# Patient Record
Sex: Female | Born: 1950 | Race: White | Hispanic: No | Smoking: Current every day smoker
Health system: Southern US, Community
[De-identification: ages and names within clinical notes are randomized; demographics above are authoritative.]

## PROBLEM LIST (undated history)

## (undated) DIAGNOSIS — J45909 Unspecified asthma, uncomplicated: Secondary | ICD-10-CM

## (undated) DIAGNOSIS — R0600 Dyspnea, unspecified: Secondary | ICD-10-CM

## (undated) DIAGNOSIS — J449 Chronic obstructive pulmonary disease, unspecified: Secondary | ICD-10-CM

## (undated) HISTORY — PX: EXPLORATORY LAPAROTOMY: SUR591

## (undated) HISTORY — PX: HYSTERECTOMY ABDOMINAL WITH SALPINGECTOMY: SHX6725

---

## 1974-06-10 HISTORY — PX: CRANIOPLASTY: SUR330

## 2005-01-30 ENCOUNTER — Ambulatory Visit: Payer: Self-pay

## 2005-02-01 ENCOUNTER — Ambulatory Visit: Payer: Self-pay

## 2005-08-05 ENCOUNTER — Ambulatory Visit: Payer: Self-pay

## 2005-08-23 ENCOUNTER — Other Ambulatory Visit: Payer: Self-pay

## 2005-08-29 ENCOUNTER — Ambulatory Visit: Payer: Self-pay | Admitting: Surgery

## 2006-02-05 ENCOUNTER — Ambulatory Visit: Payer: Self-pay

## 2007-06-24 ENCOUNTER — Ambulatory Visit: Payer: Self-pay

## 2007-09-16 ENCOUNTER — Ambulatory Visit: Payer: Self-pay | Admitting: *Deleted

## 2009-06-28 ENCOUNTER — Ambulatory Visit: Payer: Self-pay

## 2010-05-09 ENCOUNTER — Emergency Department: Payer: Self-pay | Admitting: Emergency Medicine

## 2010-10-02 ENCOUNTER — Inpatient Hospital Stay: Payer: Self-pay | Admitting: Internal Medicine

## 2011-07-10 ENCOUNTER — Ambulatory Visit: Payer: Self-pay | Admitting: Family Medicine

## 2012-08-30 ENCOUNTER — Emergency Department: Payer: Self-pay | Admitting: Emergency Medicine

## 2012-08-30 LAB — COMPREHENSIVE METABOLIC PANEL
Albumin: 3.4 g/dL (ref 3.4–5.0)
Anion Gap: 4 — ABNORMAL LOW (ref 7–16)
Bilirubin,Total: 0.3 mg/dL (ref 0.2–1.0)
Chloride: 100 mmol/L (ref 98–107)
Co2: 28 mmol/L (ref 21–32)
EGFR (African American): >60
EGFR (Non-African Amer.): >60
SGOT(AST): 27 U/L (ref 15–37)
SGPT (ALT): 23 U/L (ref 12–78)
Total Protein: 7.4 g/dL (ref 6.4–8.2)

## 2012-08-30 LAB — CBC
HCT: 37.5 % (ref 35.0–47.0)
HGB: 12.9 g/dL (ref 12.0–16.0)
MCH: 32 pg (ref 26.0–34.0)
MCHC: 34.4 g/dL (ref 32.0–36.0)
Platelet: 212 10*3/uL (ref 150–440)
RBC: 4.03 10*6/uL (ref 3.80–5.20)
RDW: 13.6 % (ref 11.5–14.5)
WBC: 8.5 10*3/uL (ref 3.6–11.0)

## 2012-08-30 LAB — URINALYSIS, COMPLETE
Bacteria: NONE SEEN
Bilirubin,UR: NEGATIVE
Glucose,UR: NEGATIVE mg/dL (ref 0–75)
Ketone: NEGATIVE
Leukocyte Esterase: NEGATIVE
RBC,UR: NONE SEEN /HPF (ref 0–5)
WBC UR: <1 /HPF (ref 0–5)

## 2012-08-30 LAB — LIPASE, BLOOD: Lipase: 167 U/L (ref 73–393)

## 2014-08-18 ENCOUNTER — Emergency Department: Payer: Self-pay | Admitting: Emergency Medicine

## 2014-08-26 ENCOUNTER — Ambulatory Visit: Payer: Self-pay | Admitting: Family Medicine

## 2015-09-20 ENCOUNTER — Other Ambulatory Visit: Payer: Self-pay | Admitting: Nurse Practitioner

## 2015-10-20 DIAGNOSIS — N3946 Mixed incontinence: Secondary | ICD-10-CM | POA: Insufficient documentation

## 2017-04-28 DIAGNOSIS — J439 Emphysema, unspecified: Secondary | ICD-10-CM | POA: Insufficient documentation

## 2017-08-20 ENCOUNTER — Other Ambulatory Visit: Payer: Self-pay | Admitting: Nurse Practitioner

## 2017-08-20 DIAGNOSIS — Z1239 Encounter for other screening for malignant neoplasm of breast: Secondary | ICD-10-CM

## 2017-09-11 ENCOUNTER — Other Ambulatory Visit: Payer: Self-pay

## 2017-09-11 DIAGNOSIS — Z1211 Encounter for screening for malignant neoplasm of colon: Secondary | ICD-10-CM

## 2017-09-12 ENCOUNTER — Telehealth: Payer: Self-pay

## 2017-09-12 NOTE — Telephone Encounter (Signed)
Gastroenterology Pre-Procedure Review  Request Date:  Requesting Physician: Dr.   PATIENT REVIEW QUESTIONS: The patient responded to the following health history questions as indicated:    1. Are you having any GI issues? yes (side pain, scar tissue, bloating) 2. Do you have a personal history of Polyps? yes (polyps, diverticulitis) 3. Do you have a family history of Colon Cancer or Polyps? yes (Uncle and Aunt) 4. Diabetes Mellitus? no 5. Joint replacements in the past 12 months?no 6. Major health problems in the past 3 months?yes (high BP) 7. Any artificial heart valves, MVP, or defibrillator?no    MEDICATIONS & ALLERGIES:    Patient reports the following regarding taking any anticoagulation/antiplatelet therapy:   Plavix, Coumadin, Eliquis, Xarelto, Lovenox, Pradaxa, Brilinta, or Effient? no Aspirin? no  Patient confirms/reports the following medications:  No current outpatient medications on file.   No current facility-administered medications for this visit.     Patient confirms/reports the following allergies:  Allergies not on file  No orders of the defined types were placed in this encounter.   AUTHORIZATION INFORMATION Primary Insurance: 1D#: Group #:  Secondary Insurance: 1D#: Group #:  SCHEDULE INFORMATION: Date: 10/16/17 Time: Location: [facility]

## 2017-09-18 ENCOUNTER — Inpatient Hospital Stay: Admission: RE | Admit: 2017-09-18 | Payer: BLUE CROSS/BLUE SHIELD | Source: Ambulatory Visit

## 2017-10-02 ENCOUNTER — Encounter: Payer: Self-pay | Admitting: Anesthesiology

## 2017-10-02 ENCOUNTER — Telehealth: Payer: Self-pay

## 2017-10-02 ENCOUNTER — Other Ambulatory Visit: Payer: Self-pay

## 2017-10-02 DIAGNOSIS — Z1211 Encounter for screening for malignant neoplasm of colon: Secondary | ICD-10-CM

## 2017-10-02 NOTE — Telephone Encounter (Signed)
[facility] has reviewed patients chart and she will need to have her colonoscopy [facility].  I've left a message for her to contact office to notify her.  I am going ahead and ordering colon[facility] with Dr. Allegra LaiVanga so she will have a spot on the schedule for May 9th.  Thanks [employer]

## 2017-10-07 ENCOUNTER — Telehealth: Payer: Self-pay | Admitting: Gastroenterology

## 2017-10-07 NOTE — Telephone Encounter (Signed)
Pt is returning Michelle's call °

## 2017-10-07 NOTE — Telephone Encounter (Signed)
Patient has been notified of colonoscopy location change and provider.  Thanks [employer]

## 2017-10-13 ENCOUNTER — Telehealth: Payer: Self-pay | Admitting: Gastroenterology

## 2017-10-13 NOTE — Telephone Encounter (Signed)
Patients call has been returned.  She inquired on the coding of the colonoscopy I informed her that we coded for screening colonoscopy z12.11.  She was concerned about the coverage as to if polyp would be removed.  I asked her to address with the doctor prior to colonoscopy.  Thanks  [employer]

## 2017-10-13 NOTE — Telephone Encounter (Signed)
Pt left vm for Marcelino Duster in regards to her colonoscopy  thursday

## 2017-10-15 ENCOUNTER — Telehealth: Payer: Self-pay | Admitting: Gastroenterology

## 2017-10-15 NOTE — Telephone Encounter (Signed)
Patient is congested and feels she needs to reschedule her colonoscopy that is tomorrow. Please call

## 2017-10-15 NOTE — Telephone Encounter (Signed)
Pt left vm for Marcelino Duster she states she is not feeling well and would like a call from [place]

## 2017-10-15 NOTE — Telephone Encounter (Signed)
LVM for pt to call me back-returning her call.  Thanks [employer]

## 2017-10-15 NOTE — Telephone Encounter (Signed)
Patient has been contacted.  She stated that she has some congestion and her mother has been sick.  She has asked to cancel her colonoscopy and will call back to reschedule.  Thanks [employer]

## 2017-10-16 ENCOUNTER — Ambulatory Visit
Admission: RE | Admit: 2017-10-16 | Payer: BLUE CROSS/BLUE SHIELD | Source: Ambulatory Visit | Admitting: Gastroenterology

## 2017-10-16 ENCOUNTER — Encounter: Admission: RE | Payer: Self-pay | Source: Ambulatory Visit

## 2017-10-16 HISTORY — DX: Dyspnea, unspecified: R06.00

## 2017-10-16 HISTORY — DX: Chronic obstructive pulmonary disease, unspecified: J44.9

## 2017-10-16 SURGERY — COLONOSCOPY WITH PROPOFOL
Anesthesia: General

## 2017-10-16 SURGERY — COLONOSCOPY WITH PROPOFOL
Anesthesia: Choice

## 2018-11-08 ENCOUNTER — Emergency Department: Payer: Medicare HMO

## 2018-11-08 ENCOUNTER — Inpatient Hospital Stay (HOSPITAL_COMMUNITY)
Admission: AD | Admit: 2018-11-08 | Payer: Medicaid Other | Source: Other Acute Inpatient Hospital | Admitting: Internal Medicine

## 2018-11-08 ENCOUNTER — Inpatient Hospital Stay
Admission: EM | Admit: 2018-11-08 | Discharge: 2018-11-17 | DRG: 871 | Disposition: A | Discharge status: Home Health | Payer: Medicare HMO | Attending: Internal Medicine | Admitting: Internal Medicine

## 2018-11-08 DIAGNOSIS — T68XXXA Hypothermia, initial encounter: Secondary | ICD-10-CM | POA: Diagnosis present

## 2018-11-08 DIAGNOSIS — K7200 Acute and subacute hepatic failure without coma: Secondary | ICD-10-CM | POA: Diagnosis present

## 2018-11-08 DIAGNOSIS — D689 Coagulation defect, unspecified: Secondary | ICD-10-CM | POA: Diagnosis present

## 2018-11-08 DIAGNOSIS — R0902 Hypoxemia: Secondary | ICD-10-CM

## 2018-11-08 DIAGNOSIS — R6521 Severe sepsis with septic shock: Secondary | ICD-10-CM | POA: Diagnosis present

## 2018-11-08 DIAGNOSIS — I5021 Acute systolic (congestive) heart failure: Secondary | ICD-10-CM | POA: Diagnosis present

## 2018-11-08 DIAGNOSIS — N179 Acute kidney failure, unspecified: Secondary | ICD-10-CM | POA: Diagnosis present

## 2018-11-08 DIAGNOSIS — G9341 Metabolic encephalopathy: Secondary | ICD-10-CM | POA: Diagnosis present

## 2018-11-08 DIAGNOSIS — E875 Hyperkalemia: Secondary | ICD-10-CM | POA: Diagnosis present

## 2018-11-08 DIAGNOSIS — E876 Hypokalemia: Secondary | ICD-10-CM | POA: Diagnosis present

## 2018-11-08 DIAGNOSIS — J9621 Acute and chronic respiratory failure with hypoxia: Secondary | ICD-10-CM | POA: Diagnosis present

## 2018-11-08 DIAGNOSIS — F1010 Alcohol abuse, uncomplicated: Secondary | ICD-10-CM | POA: Diagnosis not present

## 2018-11-08 DIAGNOSIS — A419 Sepsis, unspecified organism: Secondary | ICD-10-CM | POA: Diagnosis present

## 2018-11-08 DIAGNOSIS — J439 Emphysema, unspecified: Secondary | ICD-10-CM

## 2018-11-08 DIAGNOSIS — I4891 Unspecified atrial fibrillation: Secondary | ICD-10-CM | POA: Diagnosis not present

## 2018-11-08 DIAGNOSIS — F17210 Nicotine dependence, cigarettes, uncomplicated: Secondary | ICD-10-CM | POA: Diagnosis present

## 2018-11-08 DIAGNOSIS — Z20828 Contact with and (suspected) exposure to other viral communicable diseases: Secondary | ICD-10-CM | POA: Diagnosis present

## 2018-11-08 DIAGNOSIS — J9602 Acute respiratory failure with hypercapnia: Secondary | ICD-10-CM

## 2018-11-08 DIAGNOSIS — T510X1A Toxic effect of ethanol, accidental (unintentional), initial encounter: Secondary | ICD-10-CM | POA: Diagnosis present

## 2018-11-08 DIAGNOSIS — Z79899 Other long term (current) drug therapy: Secondary | ICD-10-CM | POA: Diagnosis not present

## 2018-11-08 DIAGNOSIS — J9690 Respiratory failure, unspecified, unspecified whether with hypoxia or hypercapnia: Secondary | ICD-10-CM

## 2018-11-08 DIAGNOSIS — J9600 Acute respiratory failure, unspecified whether with hypoxia or hypercapnia: Secondary | ICD-10-CM

## 2018-11-08 DIAGNOSIS — E872 Acidosis, unspecified: Secondary | ICD-10-CM

## 2018-11-08 DIAGNOSIS — J690 Pneumonitis due to inhalation of food and vomit: Secondary | ICD-10-CM | POA: Diagnosis present

## 2018-11-08 DIAGNOSIS — T40601A Poisoning by unspecified narcotics, accidental (unintentional), initial encounter: Secondary | ICD-10-CM | POA: Diagnosis present

## 2018-11-08 DIAGNOSIS — Z90710 Acquired absence of both cervix and uterus: Secondary | ICD-10-CM | POA: Diagnosis not present

## 2018-11-08 DIAGNOSIS — Z0189 Encounter for other specified special examinations: Secondary | ICD-10-CM

## 2018-11-08 DIAGNOSIS — J9622 Acute and chronic respiratory failure with hypercapnia: Secondary | ICD-10-CM | POA: Diagnosis present

## 2018-11-08 DIAGNOSIS — Z7951 Long term (current) use of inhaled steroids: Secondary | ICD-10-CM

## 2018-11-08 DIAGNOSIS — N183 Chronic kidney disease, stage 3 (moderate): Secondary | ICD-10-CM | POA: Diagnosis present

## 2018-11-08 DIAGNOSIS — I959 Hypotension, unspecified: Secondary | ICD-10-CM

## 2018-11-08 DIAGNOSIS — J441 Chronic obstructive pulmonary disease with (acute) exacerbation: Secondary | ICD-10-CM | POA: Diagnosis not present

## 2018-11-08 HISTORY — DX: Unspecified asthma, uncomplicated: J45.909

## 2018-11-08 LAB — URINALYSIS, ROUTINE W REFLEX MICROSCOPIC
Bacteria, UA: NONE SEEN
Bilirubin Urine: NEGATIVE
Glucose, UA: NEGATIVE mg/dL
Hgb urine dipstick: NEGATIVE
Ketones, ur: 5 mg/dL — AB
Leukocytes,Ua: NEGATIVE
Nitrite: NEGATIVE
Protein, ur: 100 mg/dL — AB
Specific Gravity, Urine: 1.021 (ref 1.005–1.030)
pH: 5 (ref 5.0–8.0)

## 2018-11-08 LAB — BLOOD GAS, ARTERIAL
Acid-Base Excess: 4.3 mmol/L — ABNORMAL HIGH (ref 0.0–2.0)
Acid-base deficit: 7.9 mmol/L — ABNORMAL HIGH (ref 0.0–2.0)
Acid-base deficit: 8.3 mmol/L — ABNORMAL HIGH (ref 0.0–2.0)
Acid-base deficit: 2.2 mmol/L — ABNORMAL HIGH (ref 0.0–2.0)
Bicarbonate: 26.4 mmol/L (ref 20.0–28.0)
Bicarbonate: 26.8 mmol/L (ref 20.0–28.0)
Bicarbonate: 29.9 mmol/L — ABNORMAL HIGH (ref 20.0–28.0)
Bicarbonate: 29.8 mmol/L — ABNORMAL HIGH (ref 20.0–28.0)
Delivery systems: POSITIVE
Delivery systems: POSITIVE
Expiratory PAP: 10
Expiratory PAP: 10
FIO2: 0.6
FIO2: 0.7
FIO2: 0.4
FIO2: 0.4
Inspiratory PAP: 18
Inspiratory PAP: 18
MECHVT: 400 mL
MECHVT: 450 mL
O2 Saturation: 87.6 %
O2 Saturation: 87.9 %
O2 Saturation: 87.5 %
O2 Saturation: 95 %
PEEP: 8 cmH2O
PEEP: 8 cmH2O
Patient temperature: 33.3
Patient temperature: 34.4
Patient temperature: 34.4
Patient temperature: 37
RATE: 30 resp/min
RATE: 30 resp/min
RATE: 20 resp/min
pCO2 arterial: 112 mmHg (ref 32.0–48.0)
pCO2 arterial: >120 mmHg (ref 32.0–48.0)
pCO2 arterial: 94 mmHg (ref 32.0–48.0)
pCO2 arterial: 47 mmHg (ref 32.0–48.0)
pH, Arterial: 7.02 — CL (ref 7.350–7.450)
pH, Arterial: 6.98 — CL (ref 7.350–7.450)
pH, Arterial: 7.14 — CL (ref 7.350–7.450)
pH, Arterial: 7.41 (ref 7.350–7.450)
pO2, Arterial: 65 mmHg — ABNORMAL LOW (ref 83.0–108.0)
pO2, Arterial: 72 mmHg — ABNORMAL LOW (ref 83.0–108.0)
pO2, Arterial: 61 mmHg — ABNORMAL LOW (ref 83.0–108.0)
pO2, Arterial: 75 mmHg — ABNORMAL LOW (ref 83.0–108.0)

## 2018-11-08 LAB — COMPREHENSIVE METABOLIC PANEL
ALT: 966 U/L — ABNORMAL HIGH (ref 0–44)
AST: 1305 U/L — ABNORMAL HIGH (ref 15–41)
Albumin: 3.2 g/dL — ABNORMAL LOW (ref 3.5–5.0)
Alkaline Phosphatase: 72 U/L (ref 38–126)
Anion gap: 14 (ref 5–15)
BUN: 14 mg/dL (ref 8–23)
CO2: 24 mmol/L (ref 22–32)
Calcium: 7.5 mg/dL — ABNORMAL LOW (ref 8.9–10.3)
Chloride: 92 mmol/L — ABNORMAL LOW (ref 98–111)
Creatinine, Ser: 1.39 mg/dL — ABNORMAL HIGH (ref 0.44–1.00)
GFR calc Af Amer: 46 mL/min — ABNORMAL LOW (ref >60)
GFR calc non Af Amer: 40 mL/min — ABNORMAL LOW (ref >60)
Glucose, Bld: 121 mg/dL — ABNORMAL HIGH (ref 70–99)
Potassium: 6.4 mmol/L (ref 3.5–5.1)
Sodium: 130 mmol/L — ABNORMAL LOW (ref 135–145)
Total Bilirubin: 1.3 mg/dL — ABNORMAL HIGH (ref 0.3–1.2)
Total Protein: 6.6 g/dL (ref 6.5–8.1)

## 2018-11-08 LAB — BASIC METABOLIC PANEL
Anion gap: 14 (ref 5–15)
BUN: 16 mg/dL (ref 8–23)
CO2: 24 mmol/L (ref 22–32)
Calcium: 7.9 mg/dL — ABNORMAL LOW (ref 8.9–10.3)
Chloride: 93 mmol/L — ABNORMAL LOW (ref 98–111)
Creatinine, Ser: 1.36 mg/dL — ABNORMAL HIGH (ref 0.44–1.00)
GFR calc Af Amer: 47 mL/min — ABNORMAL LOW (ref >60)
GFR calc non Af Amer: 41 mL/min — ABNORMAL LOW (ref >60)
Glucose, Bld: 153 mg/dL — ABNORMAL HIGH (ref 70–99)
Potassium: 5.6 mmol/L — ABNORMAL HIGH (ref 3.5–5.1)
Sodium: 131 mmol/L — ABNORMAL LOW (ref 135–145)

## 2018-11-08 LAB — BRAIN NATRIURETIC PEPTIDE: B Natriuretic Peptide: 623 pg/mL — ABNORMAL HIGH (ref 0.0–100.0)

## 2018-11-08 LAB — MRSA PCR SCREENING: MRSA by PCR: NEGATIVE

## 2018-11-08 LAB — CBC
HCT: 49.6 % — ABNORMAL HIGH (ref 36.0–46.0)
Hemoglobin: 15.7 g/dL — ABNORMAL HIGH (ref 12.0–15.0)
MCH: 30.9 pg (ref 26.0–34.0)
MCHC: 31.7 g/dL (ref 30.0–36.0)
MCV: 97.6 fL (ref 80.0–100.0)
Platelets: 287 10*3/uL (ref 150–400)
RBC: 5.08 MIL/uL (ref 3.87–5.11)
RDW: 16.8 % — ABNORMAL HIGH (ref 11.5–15.5)
WBC: 15.2 10*3/uL — ABNORMAL HIGH (ref 4.0–10.5)
nRBC: 0.3 % — ABNORMAL HIGH (ref 0.0–0.2)

## 2018-11-08 LAB — APTT: aPTT: 81 seconds — ABNORMAL HIGH (ref 24–36)

## 2018-11-08 LAB — GLUCOSE, CAPILLARY
Glucose-Capillary: 111 mg/dL — ABNORMAL HIGH (ref 70–99)
Glucose-Capillary: 155 mg/dL — ABNORMAL HIGH (ref 70–99)

## 2018-11-08 LAB — TSH: TSH: 2.136 u[IU]/mL (ref 0.350–4.500)

## 2018-11-08 LAB — PROTIME-INR
INR: 5.8 (ref 0.8–1.2)
Prothrombin Time: 50.9 seconds — ABNORMAL HIGH (ref 11.4–15.2)

## 2018-11-08 LAB — LACTIC ACID, PLASMA
Lactic Acid, Venous: 2.4 mmol/L (ref 0.5–1.9)
Lactic Acid, Venous: 2.1 mmol/L (ref 0.5–1.9)

## 2018-11-08 LAB — TROPONIN I
Troponin I: <0.03 ng/mL (ref <0.03)
Troponin I: 0.06 ng/mL (ref <0.03)

## 2018-11-08 LAB — ACETAMINOPHEN LEVEL: Acetaminophen (Tylenol), Serum: <10 ug/mL — ABNORMAL LOW (ref 10–30)

## 2018-11-08 LAB — ETHANOL: Alcohol, Ethyl (B): <10 mg/dL (ref <10)

## 2018-11-08 LAB — SARS CORONAVIRUS 2 BY RT PCR (HOSPITAL ORDER, PERFORMED IN ~~LOC~~ HOSPITAL LAB): SARS Coronavirus 2: NEGATIVE

## 2018-11-08 LAB — SALICYLATE LEVEL: Salicylate Lvl: <7 mg/dL (ref 2.8–30.0)

## 2018-11-08 LAB — PROCALCITONIN: Procalcitonin: 0.51 ng/mL

## 2018-11-08 MED ORDER — DILTIAZEM HCL 25 MG/5ML IV SOLN
INTRAVENOUS | Status: AC
  Administered 2018-11-08: 13:00:00 10 mg via INTRAVENOUS
  Filled 2018-11-08: qty 5

## 2018-11-08 MED ORDER — SODIUM CHLORIDE 0.9 % IV SOLN
2.0000 g | Freq: Two times a day (BID) | INTRAVENOUS | Status: DC
  Administered 2018-11-08 – 2018-11-09 (×2): 2 g via INTRAVENOUS
  Filled 2018-11-08 (×3): qty 2

## 2018-11-08 MED ORDER — LORAZEPAM 2 MG/ML IJ SOLN: 1.0000 mg | Freq: Once | INTRAMUSCULAR | Status: DC

## 2018-11-08 MED ORDER — NOREPINEPHRINE 4 MG/250ML-% IV SOLN
0.0000 ug/min | INTRAVENOUS | Status: DC
  Administered 2018-11-08: 30 ug/min via INTRAVENOUS
  Administered 2018-11-08: 18:00:00 35 ug/min via INTRAVENOUS
  Administered 2018-11-08: 0.5 ug/min via INTRAVENOUS
  Administered 2018-11-08: 16:00:00 35 ug/min via INTRAVENOUS
  Administered 2018-11-09: 07:00:00 8 ug/min via INTRAVENOUS
  Administered 2018-11-09: 01:00:00 14 ug/min via INTRAVENOUS
  Administered 2018-11-09: 16:00:00 8 ug/min via INTRAVENOUS
  Administered 2018-11-10 (×2): 6 ug/min via INTRAVENOUS
  Administered 2018-11-11: 02:00:00 4 ug/min via INTRAVENOUS
  Filled 2018-11-08 (×8): qty 250

## 2018-11-08 MED ORDER — IPRATROPIUM-ALBUTEROL 0.5-2.5 (3) MG/3ML IN SOLN
RESPIRATORY_TRACT | Status: AC
  Filled 2018-11-08: qty 6

## 2018-11-08 MED ORDER — FENTANYL 2500MCG IN NS 250ML (10MCG/ML) PREMIX INFUSION
INTRAVENOUS | Status: AC
  Filled 2018-11-08: qty 250

## 2018-11-08 MED ORDER — NOREPINEPHRINE 4 MG/250ML-% IV SOLN
INTRAVENOUS | Status: AC
  Administered 2018-11-08: 30 ug/min via INTRAVENOUS
  Filled 2018-11-08: qty 250

## 2018-11-08 MED ORDER — HALOPERIDOL LACTATE 5 MG/ML IJ SOLN
5.0000 mg | Freq: Once | INTRAMUSCULAR | Status: AC
  Administered 2018-11-08: 5 mg via INTRAVENOUS

## 2018-11-08 MED ORDER — HALOPERIDOL LACTATE 5 MG/ML IJ SOLN
INTRAMUSCULAR | Status: AC
  Administered 2018-11-08: 5 mg via INTRAVENOUS
  Filled 2018-11-08: qty 1

## 2018-11-08 MED ORDER — SODIUM CHLORIDE 0.9 % IV BOLUS
1000.0000 mL | Freq: Once | INTRAVENOUS | Status: AC
  Administered 2018-11-08: 1000 mL via INTRAVENOUS

## 2018-11-08 MED ORDER — SODIUM CHLORIDE 0.9 % IV SOLN
2.0000 g | Freq: Once | INTRAVENOUS | Status: AC
  Administered 2018-11-08: 2 g via INTRAVENOUS
  Filled 2018-11-08: qty 2

## 2018-11-08 MED ORDER — HALOPERIDOL LACTATE 5 MG/ML IJ SOLN
INTRAMUSCULAR | Status: AC
  Filled 2018-11-08: qty 1

## 2018-11-08 MED ORDER — SODIUM CHLORIDE 0.9 % IV SOLN
8.0000 mg/h | INTRAVENOUS | Status: DC
  Administered 2018-11-08 – 2018-11-09 (×2): 8 mg/h via INTRAVENOUS
  Filled 2018-11-08 (×3): qty 80

## 2018-11-08 MED ORDER — PROPOFOL 1000 MG/100ML IV EMUL
INTRAVENOUS | Status: AC
  Administered 2018-11-08: 5 ug/kg/min via INTRAVENOUS
  Filled 2018-11-08: qty 100

## 2018-11-08 MED ORDER — VANCOMYCIN HCL 500 MG IV SOLR
500.0000 mg | INTRAVENOUS | Status: DC
  Administered 2018-11-08: 500 mg via INTRAVENOUS
  Filled 2018-11-08 (×2): qty 500

## 2018-11-08 MED ORDER — FENTANYL 2500MCG IN NS 250ML (10MCG/ML) PREMIX INFUSION
0.0000 ug/h | INTRAVENOUS | Status: DC
  Administered 2018-11-08: 10 ug/h via INTRAVENOUS
  Administered 2018-11-09: 01:00:00 400 ug/h via INTRAVENOUS
  Administered 2018-11-09: 150 ug/h via INTRAVENOUS
  Administered 2018-11-10: 16:00:00 175 ug/h via INTRAVENOUS
  Administered 2018-11-10: 02:00:00 225 ug/h via INTRAVENOUS
  Administered 2018-11-11: 175 ug/h via INTRAVENOUS
  Filled 2018-11-08 (×5): qty 250

## 2018-11-08 MED ORDER — NALOXONE HCL 2 MG/2ML IJ SOSY
2.0000 mg | PREFILLED_SYRINGE | Freq: Once | INTRAMUSCULAR | Status: AC
  Administered 2018-11-08: 2 mg via INTRAVENOUS

## 2018-11-08 MED ORDER — VANCOMYCIN HCL 500 MG IV SOLR
500.0000 mg | INTRAVENOUS | Status: DC
  Filled 2018-11-08: qty 500

## 2018-11-08 MED ORDER — IPRATROPIUM-ALBUTEROL 0.5-2.5 (3) MG/3ML IN SOLN
RESPIRATORY_TRACT | Status: AC
  Filled 2018-11-08: qty 3

## 2018-11-08 MED ORDER — SODIUM BICARBONATE 8.4 % IV SOLN
INTRAVENOUS | Status: AC
  Filled 2018-11-08: qty 50

## 2018-11-08 MED ORDER — PROPOFOL 1000 MG/100ML IV EMUL
INTRAVENOUS | Status: AC
  Administered 2018-11-08: 45 ug/kg/min via INTRAVENOUS
  Filled 2018-11-08: qty 100

## 2018-11-08 MED ORDER — SODIUM BICARBONATE 8.4 % IV SOLN
50.0000 meq | Freq: Once | INTRAVENOUS | Status: AC
  Administered 2018-11-08: 50 meq via INTRAVENOUS

## 2018-11-08 MED ORDER — VANCOMYCIN HCL IN DEXTROSE 1-5 GM/200ML-% IV SOLN
1000.0000 mg | Freq: Once | INTRAVENOUS | Status: AC
  Administered 2018-11-08: 1000 mg via INTRAVENOUS
  Filled 2018-11-08: qty 200

## 2018-11-08 MED ORDER — IPRATROPIUM-ALBUTEROL 0.5-2.5 (3) MG/3ML IN SOLN
3.0000 mL | Freq: Once | RESPIRATORY_TRACT | Status: AC
  Administered 2018-11-08: 3 mL via RESPIRATORY_TRACT

## 2018-11-08 MED ORDER — ORAL CARE MOUTH RINSE
15.0000 mL | OROMUCOSAL | Status: DC
  Administered 2018-11-08 – 2018-11-14 (×49): 15 mL via OROMUCOSAL

## 2018-11-08 MED ORDER — SUCCINYLCHOLINE CHLORIDE 20 MG/ML IJ SOLN
100.0000 mg | Freq: Once | INTRAMUSCULAR | Status: AC
  Administered 2018-11-08: 100 mg via INTRAVENOUS

## 2018-11-08 MED ORDER — SODIUM BICARBONATE 8.4 % IV SOLN
INTRAVENOUS | Status: DC
  Administered 2018-11-08 – 2018-11-09 (×3): via INTRAVENOUS
  Filled 2018-11-08 (×7): qty 150

## 2018-11-08 MED ORDER — CHLORHEXIDINE GLUCONATE 0.12% ORAL RINSE (MEDLINE KIT)
15.0000 mL | Freq: Two times a day (BID) | OROMUCOSAL | Status: DC
  Administered 2018-11-08 – 2018-11-14 (×11): 15 mL via OROMUCOSAL

## 2018-11-08 MED ORDER — IPRATROPIUM-ALBUTEROL 0.5-2.5 (3) MG/3ML IN SOLN
3.0000 mL | Freq: Four times a day (QID) | RESPIRATORY_TRACT | Status: DC
  Administered 2018-11-09 – 2018-11-10 (×7): 3 mL via RESPIRATORY_TRACT
  Filled 2018-11-08 (×6): qty 3

## 2018-11-08 MED ORDER — PROPOFOL 1000 MG/100ML IV EMUL
5.0000 ug/kg/min | INTRAVENOUS | Status: DC
  Administered 2018-11-08: 45 ug/kg/min via INTRAVENOUS
  Administered 2018-11-08: 5 ug/kg/min via INTRAVENOUS

## 2018-11-08 MED ORDER — HALOPERIDOL LACTATE 5 MG/ML IJ SOLN
5.0000 mg | Freq: Once | INTRAMUSCULAR | Status: AC
  Administered 2018-11-08: 10:00:00 5 mg via INTRAVENOUS

## 2018-11-08 MED ORDER — VANCOMYCIN HCL 10 G IV SOLR: 1.0000 g | Freq: Once | INTRAVENOUS | Status: DC

## 2018-11-08 MED ORDER — NOREPINEPHRINE 4 MG/250ML-% IV SOLN
INTRAVENOUS | Status: AC
  Administered 2018-11-08: 0.5 ug/min via INTRAVENOUS
  Filled 2018-11-08: qty 250

## 2018-11-08 MED ORDER — LORAZEPAM 2 MG/ML IJ SOLN
1.0000 mg | Freq: Once | INTRAMUSCULAR | Status: AC
  Administered 2018-11-08: 1 mg via INTRAVENOUS

## 2018-11-08 MED ORDER — DILTIAZEM HCL 25 MG/5ML IV SOLN
10.0000 mg | Freq: Once | INTRAVENOUS | Status: AC
  Administered 2018-11-08: 10 mg via INTRAVENOUS

## 2018-11-08 MED ORDER — NALOXONE HCL 2 MG/2ML IJ SOSY
PREFILLED_SYRINGE | INTRAMUSCULAR | Status: AC
  Administered 2018-11-08: 2 mg
  Filled 2018-11-08: qty 4

## 2018-11-08 MED ORDER — IPRATROPIUM-ALBUTEROL 0.5-2.5 (3) MG/3ML IN SOLN
3.0000 mL | RESPIRATORY_TRACT | Status: DC | PRN
  Administered 2018-11-08: 3 mL via RESPIRATORY_TRACT
  Filled 2018-11-08: qty 3

## 2018-11-08 MED ORDER — SODIUM CHLORIDE 0.9 % IV SOLN
80.0000 mg | Freq: Once | INTRAVENOUS | Status: AC
  Administered 2018-11-08: 80 mg via INTRAVENOUS
  Filled 2018-11-08: qty 80

## 2018-11-08 MED ORDER — PANTOPRAZOLE SODIUM 40 MG IV SOLR: 40.0000 mg | Freq: Two times a day (BID) | INTRAVENOUS | Status: DC

## 2018-11-08 MED ORDER — PROPOFOL 1000 MG/100ML IV EMUL
5.0000 ug/kg/min | INTRAVENOUS | Status: DC
  Administered 2018-11-08: 75 ug/kg/min via INTRAVENOUS
  Administered 2018-11-09: 40 ug/kg/min via INTRAVENOUS
  Administered 2018-11-10 (×3): 20 ug/kg/min via INTRAVENOUS
  Administered 2018-11-11: 30 ug/kg/min via INTRAVENOUS
  Administered 2018-11-11: 20 ug/kg/min via INTRAVENOUS
  Administered 2018-11-12: 07:00:00 30 ug/kg/min via INTRAVENOUS
  Administered 2018-11-12: 50 ug/kg/min via INTRAVENOUS
  Filled 2018-11-08 (×9): qty 100

## 2018-11-08 MED ORDER — MIDAZOLAM HCL 2 MG/2ML IJ SOLN
2.0000 mg | Freq: Once | INTRAMUSCULAR | Status: AC
  Administered 2018-11-08: 13:00:00 2 mg via INTRAVENOUS

## 2018-11-08 MED ORDER — LORAZEPAM 2 MG/ML IJ SOLN
INTRAMUSCULAR | Status: AC
  Filled 2018-11-08: qty 1

## 2018-11-08 NOTE — ED Notes (Signed)
Pt placed on bipap at this time.

## 2018-11-08 NOTE — ED Notes (Signed)
Pt brother Kathlene November [phone] given update on pt condition

## 2018-11-08 NOTE — ED Notes (Signed)
Fluids paused at this time. Verbal order from MD.

## 2018-11-08 NOTE — ED Notes (Signed)
RT changed bipap mask sizes

## 2018-11-08 NOTE — ED Notes (Signed)
New bag of levophed started

## 2018-11-08 NOTE — ED Notes (Signed)
Date and time results received: 11/08/18 1430   Test: lactic acid Critical Value: 2.1  Name of Provider Notified: Dr. Darnelle Catalan

## 2018-11-08 NOTE — ED Notes (Signed)
Fluids placed on pressure bag.

## 2018-11-08 NOTE — Progress Notes (Signed)
CODE SEPSIS - PHARMACY COMMUNICATION  **Broad Spectrum Antibiotics should be administered within 1 hour of Sepsis diagnosis**  Time Code Sepsis Called/Page Received: 1523  Antibiotics Ordered: vanc/cefepime  Time of 1st antibiotic administration: 1141 (prior to code sepsis page)  Additional action taken by pharmacy: none  If necessary, Name of Provider/Nurse Contacted: none   Pricilla Riffle, PharmD Pharmacy Resident  11/08/2018 3:30 PM

## 2018-11-08 NOTE — ED Provider Notes (Addendum)
[facility] Emergency Department Provider Note   ____________________________________________   First MD Initiated Contact with Patient 11/08/18 1106     (approximate)  I have reviewed the triage vital signs and the nursing notes.   HISTORY  Chief Complaint No chief complaint on file.  Complaint is unresponsive HPI Angela Berry is a 68 y.o. female who is brought in by EMS.  EMS reports she is staying with somebody they found her unresponsive last night they stayed with her all night long with today she was still unresponsive they called EMS.  EMS reports pinpoint pupils.  They gave her 5 of Narcan and she woke up and was confused.  Pupils became more mid position.  In the emergency room she is confused will not lay still will not stop trying to get off the bed.  We gave her 5 of Haldol and then 1 mg of Ativan and 5 more Haldol as the first 5 of Haldol did not do anything.  She was wheezing we gave her a DuoNeb.  Evaluating the patient she has no JVD shows no edema no enlarged liver on palpation.  Chest x-ray comes back as looking like CHF.  Patient is put on BiPAP initial blood gases very bad however patient is now on BiPAP after bit on BiPAP and head CT repeat blood gas was worse patient was then intubated with #7 tube under direct vision tube went in well patient's hypothermic at 93 she has been on bear hugger white blood count is elevated lactic acid is elevated Maxipime was ordered as well as Vanco.  No obvious source presently.        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 Authorizing Provider  amLODipine (NORVASC) 5 MG tablet Take 5 mg by mouth daily. 10/07/18  Yes [provider]  lisinopril (PRINIVIL,ZESTRIL) 40 MG tablet Take 40 mg by mouth daily.    Yes [provider]  metoprolol succinate (TOPROL-XL) 50 MG 24 hr tablet Take 50 mg by mouth daily.    Yes [provider]  albuterol (PROVENTIL HFA) 108 (90 Base) MCG/ACT inhaler Inhale into the lungs.    [provider]  budesonide-formoterol (SYMBICORT) 160-4.5 MCG/ACT inhaler Inhale into the lungs. 03/12/17 03/12/18  [provider]  cetirizine (ZYRTEC) 10 MG tablet Take 10 mg by mouth daily.    [provider]  ipratropium (ATROVENT HFA) 17 MCG/ACT inhaler Inhale into the lungs.    [provider]  ipratropium-albuterol (DUONEB) 0.5-2.5 (3) MG/3ML SOLN  03/04/17   [provider]    Allergies Patient has no known allergies.  History reviewed. No pertinent family history.  Social History Social History   Tobacco Use   Smoking status: Current Every Day Smoker    Packs/day: 1.00    Years: 40.00    Pack years: 40.00    Types: Cigarettes   Smokeless tobacco: Never Used  Substance Use Topics   Alcohol use: Yes    Comment:  occasional   Drug use: Not on file    Review of Systems Unable to obtain due to mental status  ____________________________________________   PHYSICAL EXAM:  VITAL SIGNS: ED Triage Vitals  Enc Vitals Group     BP 11/08/18 1022 (!) 87/76     Pulse Rate 11/08/18 1022 (!) 105     Resp 11/08/18 1022 (!) 36     Temp 11/08/18 1130 (!) 93.8 F (34.3 C)     Temp Source 11/08/18 1130 Rectal     SpO2 11/08/18 1022 93 %     Weight --      Height --      Head Circumference --      Peak Flow --      Pain Score 11/08/18 1022 0     Pain Loc --      Pain Edu? --      Excl. in GC? --    Constitutional: Confused and  combative Eyes: Conjunctivae are normal.  Pupils initially mid position and go back down to small with some more Narcan patient pupils become  mid position again Head: Atraumatic. Nose: No congestion/rhinnorhea. Mouth/Throat: Mucous membranes are moist.  Oropharynx non-erythematous. Neck: No stridor.  Supple Cardiovascular: Normal rate, regular rhythm. Grossly normal heart sounds.  Good peripheral circulation.  Later rapid rate and irregular rhythm Respiratory: Normal respiratory effort.  No retractions. Lungs wheezes Gastrointestinal: Soft and nontender. No distention. No abdominal bruits.  Musculoskeletal: No lower extremity tenderness nor edema.  . Neurologic: Confused combative but moving all extremities equally and well Skin:  Skin is warm, dry and intact. No rash noted.   ____________________________________________   LABS (all labs ordered are listed, but only abnormal results are displayed)  Labs Reviewed  CBC - Abnormal; Notable for the following components:      Result Value   WBC 15.2 (*)    Hemoglobin 15.7 (*)    HCT 49.6 (*)    RDW 16.8 (*)    nRBC 0.3 (*)    All other components within normal limits  BRAIN NATRIURETIC PEPTIDE - Abnormal; Notable for the following components:   B Natriuretic Peptide 623.0 (*)    All other components within normal limits  BLOOD GAS, ARTERIAL - Abnormal; Notable for the following components:   pH, Arterial 7.02 (*)    pCO2 arterial 112 (*)    pO2, Arterial 65 (*)    Acid-base deficit 7.9 (*)    All other components within normal limits  GLUCOSE, CAPILLARY - Abnormal; Notable for the following components:   Glucose-Capillary 111 (*)    All other components within normal limits  ACETAMINOPHEN LEVEL - Abnormal; Notable for the following components:   Acetaminophen (Tylenol), Serum <10 (*)    All other components within normal limits  PROTIME-INR - Abnormal; Notable for the following components:   Prothrombin Time 50.9 (*)    INR  5.8 (*)    All other components within normal limits  APTT - Abnormal; Notable for the following components:   aPTT 81 (*)    All other components within normal limits  LACTIC ACID, PLASMA - Abnormal; Notable for the following components:   Lactic Acid, Venous 2.4 (*)    All other components within normal limits  BLOOD GAS, ARTERIAL - Abnormal; Notable for the following components:   pH, Arterial 6.98 (*)    pCO2 arterial >120.0 (*)    pO2, Arterial 72 (*)    Acid-base deficit 8.3 (*)    All  other components within normal limits  SARS CORONAVIRUS 2 (HOSPITAL ORDER, PERFORMED IN [place] [facility])  CULTURE, BLOOD (ROUTINE X 2)  CULTURE, BLOOD (ROUTINE X 2)  ETHANOL  SALICYLATE LEVEL  LACTIC ACID, PLASMA  COMPREHENSIVE METABOLIC PANEL  TROPONIN I  TSH  BLOOD GAS, ARTERIAL  CBG MONITORING, ED   ____________________________________________  EKG  Initial EKG read interpreted by me shows sinus tachycardia at a rate of 108 rightward axis decreased R wave progression no acute ST-T wave changes EKG #2 shows A. fib with RVR at a rate of 131 same axis no acute ST-T wave changes ____________________________________________  RADIOLOGY  ED MD interpretation: Head CT read by radiology reviewed by me shows no intracranial changes there are changes consistent with sinusitis however  Official radiology report(s): Ct Head Wo Contrast  Result Date: 11/08/2018 CLINICAL DATA:  Found unresponsive. EXAM: CT HEAD WITHOUT CONTRAST TECHNIQUE: Contiguous axial images were obtained from the base of the skull through the vertex without intravenous contrast. COMPARISON:  None. FINDINGS: Brain: Limited motion degraded scan. No evidence of parenchymal hemorrhage or extra-axial fluid collection. No mass lesion, mass effect, or midline shift. No CT evidence of acute infarction. Cerebral volume is age appropriate. No ventriculomegaly. Vascular: No acute abnormality. Skull: No evidence of calvarial  fracture. Sinuses/Orbits: Fluid level in the right maxillary sinus. Mucoperiosteal thickening in the ethmoidal air cells and maxillary sinuses. Other:  The mastoid air cells are unopacified. IMPRESSION: 1. Limited motion degraded scan. 2. No evidence of acute intracranial abnormality. No evidence of calvarial fracture. 3. Mucoperiosteal thickening in paranasal sinuses with fluid level in the right maxillary sinus, cannot exclude acute sinusitis. Electronically Signed   By: Delbert Phenix M.D.   On: 11/08/2018 12:25   Dg Chest Portable 1 View  Result Date: 11/08/2018 CLINICAL DATA:  Respiratory difficulty EXAM: PORTABLE CHEST 1 VIEW COMPARISON:  1013 hours today FINDINGS: Endotracheal tube placed. Tip is 4.7 cm from the carina. NG tube placed with its tip beyond the gastric fundus. Stable vascular congestion. Basilar interstitial edema has improved. No pneumothorax. IMPRESSION: Endotracheal and NG tubes placed in appropriate position. Improving interstitial edema. Electronically Signed   By: Jolaine Click M.D.   On: 11/08/2018 13:20   Dg Chest Portable 1 View  Result Date: 11/08/2018 CLINICAL DATA:  Unresponsive EXAM: PORTABLE CHEST 1 VIEW COMPARISON:  08/30/2012 FINDINGS: The cardiac silhouette is accentuated by AP technique. It is likely normal in size. Vascular congestion. Mild interstitial edema is suspected with prominent interstitial markings towards the lung bases. No pneumothorax or pleural effusion. IMPRESSION: Vascular congestion and basilar interstitial edema. Electronically Signed   By: Jolaine Click M.D.   On: 11/08/2018 10:41   Dg Abd Portable 1 View  Result Date: 11/08/2018 CLINICAL DATA:  Evaluate OG tube placement EXAM: PORTABLE ABDOMEN - 1 VIEW COMPARISON:  Chest x-ray Nov 08, 2018 FINDINGS: The OG tube terminates between 2 and 3 cm below the GE junction. The distal tip is in the body of the stomach. IMPRESSION: The OG tube is in good position. Electronically Signed   By: Gerome Sam III  M.D   On: 11/08/2018 13:22    ____________________________________________   PROCEDURES  Procedure(s) performed (including Critical Care): Patient intubated with #7 tube straight blade 22 at the lips under direct vision.  Patient came and evaluated by me put on BiPAP levo fed started for low blood pressure fluids initially were given and then stopped because of the possible CHF.  Patient is on Levophed currently.  We are giving her Maxipime and vancomycin.  Intubation done by me.  Critical care time 3.3 hours.  This includes almost constant being at the bedside, reviewing old records and talking to the hospitalist and then with charge nurse to find out if there are any ICU beds.  It took quite some time to develop some ICU beds because of the volume in the hospital and ended up talking to [facility] as well at least twice.  Then an ICU bed became available and I talked to the hospitalist again.  And then I had to put a central line in.  I tried  in the patient's right groin and this was unable to thread even I got good blood flow.  Therefore I switched to the left groin and got good blood flow and the wire threaded well and I was able to insert the central line without any difficulty.   ____________________________________________   INITIAL IMPRESSION / ASSESSMENT AND PLAN / ED COURSE  Patient is sedated intubated on pressors and antibiotics getting fluids and the ICU Dr. Marchelle Gearing wanted a bicarb drip which I ordered patient is getting this as well she will be in ICU patient.  She did apparently aspirate.  Respiratory reports suctioning thick material out of her lungs that look like what the NG tube was bringing out only but the NG tube was bringing very liquid material so it seems as though the material in the lung has become somewhat dried.              ____________________________________________   FINAL CLINICAL IMPRESSION(S) / ED DIAGNOSES  Final diagnoses:  Sepsis, due to  unspecified organism, unspecified whether acute organ dysfunction present (HCC)  Hypothermia, initial encounter  Hypotension, unspecified hypotension type  Acidosis  Hypoxia     ED Discharge Orders    None       Note:  This document was prepared using Dragon voice recognition software and may include unintentional dictation errors.  ----------------------------------------- 1:43 PM on 11/08/2018 -----------------------------------------   The only phone number we have is [phone].  I called this number and there is no answer and the message came on same with his phone has a mailbox is not set up by by I have no way of contacting any family.  Additionally apparently our ICU has no beds and will not for 6 to 8 hours.  I will try to contact [facility] about transferring this patient as soon as the CMP gets back which is not yet.   Arnaldo Natal, MD 11/08/18 1259    Arnaldo Natal, MD 11/08/18 1742    Arnaldo Natal, MD 11/08/18 1745

## 2018-11-08 NOTE — ED Notes (Signed)
Lab called critical potassium of 6.4

## 2018-11-08 NOTE — Progress Notes (Addendum)
Call from [facility] ER to [place] [facility] ccm MD  Unresponsive since last night with pin point pupuils Did respond to narcah but got agitated and combative Needed intubation ET tube aspriated thick and cloudy per ER doc.   136/70 on 35 levophed  On diprivan On vent 40% Max and cefpeime COVID negative Very acidotic Results for Angela Berry, Angela Berry (MRN [id number]) as of 11/08/2018 15:43  Ref. Range 11/08/2018 10:28  Acetaminophen (Tylenol), S Latest Ref Range: 10 - 30 ug/mL <10 (L)  Results for Angela Berry (MRN [id number]) as of 11/08/2018 15:43  Ref. Range 11/08/2018 10:28  Salicylate Lvl Latest Ref Range: 2.8 - 30.0 mg/dL <1.9   A Acidosis Acute resp failuire Renal failure Shock  PLAN  - no beds at [facility] - Start bic gttt at [facility]  - place cvl at [facility] - more fluid bolus at [facility]   -  - d/w again with ER doc and 89M RN Jacquenette Shone with [facility] - priority is to get [facility] ICU bed for this unstable patient     SIGNATURE    Dr. Kalman Shan, M.D., F.C.C.P,  Pulmonary and Critical Care Medicine Staff Physician, [facility] Center Director - Interstitial Lung Disease  Program  Pulmonary Fibrosis [facility] at [place], [place], [address]  Pager: [phone], If no answer or between  15:00h - 7:00h: call [phone] Telephone: [phone]  3:48 PM 11/08/2018     LABS    PULMONARY Recent Labs  Lab 11/08/18 1035 11/08/18 1204 11/08/18 1339  PHART 7.02* 6.98* 7.14*  PCO2ART 112* >120.0* 94*  PO2ART 65* 72* 61*  HCO3 26.4 26.8 29.9*  O2SAT 87.6 87.9 87.5    CBC Recent Labs  Lab 11/08/18 1028  HGB 15.7*  HCT 49.6*  WBC 15.2*  PLT 287    COAGULATION Recent Labs  Lab 11/08/18 1122  INR 5.8*    CARDIAC   Recent Labs  Lab 11/08/18 1250  TROPONINI <0.03   No results for input(s): PROBNP in the last 168 hours.   CHEMISTRY Recent Labs  Lab 11/08/18 1250 11/08/18 1510  NA 130* 131*  K 6.4* 5.6*  CL 92* 93*   CO2 24 24  GLUCOSE 121* 153*  BUN 14 16  CREATININE 1.39* 1.36*  CALCIUM 7.5* 7.9*   CrCl cannot be calculated (Unknown ideal weight.).   LIVER Recent Labs  Lab 11/08/18 1122 11/08/18 1250  AST  --  1,305*  ALT  --  966*  ALKPHOS  --  72  BILITOT  --  1.3*  PROT  --  6.6  ALBUMIN  --  3.2*  INR 5.8*  --      INFECTIOUS Recent Labs  Lab 11/08/18 1138 11/08/18 1355  LATICACIDVEN 2.4* 2.1*     ENDOCRINE CBG (last 3)  Recent Labs    11/08/18 1037  GLUCAP 111*         IMAGING x48h  - image(s) personally visualized  -   highlighted in bold Ct Head Wo Contrast  Result Date: 11/08/2018 CLINICAL DATA:  Found unresponsive. EXAM: CT HEAD WITHOUT CONTRAST TECHNIQUE: Contiguous axial images were obtained from the base of the skull through the vertex without intravenous contrast. COMPARISON:  None. FINDINGS: Brain: Limited motion degraded scan. No evidence of parenchymal hemorrhage or extra-axial fluid collection. No mass lesion, mass effect, or midline shift. No CT evidence of acute infarction. Cerebral volume is age appropriate. No ventriculomegaly. Vascular:  No acute abnormality. Skull: No evidence of calvarial fracture. Sinuses/Orbits: Fluid level in the right maxillary sinus. Mucoperiosteal thickening in the ethmoidal air cells and maxillary sinuses. Other:  The mastoid air cells are unopacified. IMPRESSION: 1. Limited motion degraded scan. 2. No evidence of acute intracranial abnormality. No evidence of calvarial fracture. 3. Mucoperiosteal thickening in paranasal sinuses with fluid level in the right maxillary sinus, cannot exclude acute sinusitis. Electronically Signed   By: Delbert PhenixJason A Poff M.D.   On: 11/08/2018 12:25   Dg Chest Portable 1 View  Result Date: 11/08/2018 CLINICAL DATA:  Respiratory difficulty EXAM: PORTABLE CHEST 1 VIEW COMPARISON:  1013 hours today FINDINGS: Endotracheal tube placed. Tip is 4.7 cm from the carina. NG tube placed with its tip beyond the  gastric fundus. Stable vascular congestion. Basilar interstitial edema has improved. No pneumothorax. IMPRESSION: Endotracheal and NG tubes placed in appropriate position. Improving interstitial edema. Electronically Signed   By: Jolaine ClickArthur  Hoss M.D.   On: 11/08/2018 13:20   Dg Chest Portable 1 View  Result Date: 11/08/2018 CLINICAL DATA:  Unresponsive EXAM: PORTABLE CHEST 1 VIEW COMPARISON:  08/30/2012 FINDINGS: The cardiac silhouette is accentuated by AP technique. It is likely normal in size. Vascular congestion. Mild interstitial edema is suspected with prominent interstitial markings towards the lung bases. No pneumothorax or pleural effusion. IMPRESSION: Vascular congestion and basilar interstitial edema. Electronically Signed   By: Jolaine ClickArthur  Hoss M.D.   On: 11/08/2018 10:41   Dg Abd Portable 1 View  Result Date: 11/08/2018 CLINICAL DATA:  Evaluate OG tube placement EXAM: PORTABLE ABDOMEN - 1 VIEW COMPARISON:  Chest x-ray Nov 08, 2018 FINDINGS: The OG tube terminates between 2 and 3 cm below the GE junction. The distal tip is in the body of the stomach. IMPRESSION: The OG tube is in good position. Electronically Signed   By: Gerome Samavid  Williams III M.D   On: 11/08/2018 13:22    Anti-infectives (From admission, onward)   Start     Dose/Rate Route Frequency Ordered Stop   11/08/18 1145  ceFEPIme (MAXIPIME) 2 g in sodium chloride 0.9 % 100 mL IVPB     2 g 200 mL/hr over 30 Minutes Intravenous  Once 11/08/18 1131 11/08/18 1209   11/08/18 1145  vancomycin (VANCOCIN) injection 1 g  Status:  Discontinued     1 g Intravenous  Once 11/08/18 1131 11/08/18 1143   11/08/18 1145  vancomycin (VANCOCIN) IVPB 1000 mg/200 mL premix     1,000 mg 200 mL/hr over 60 Minutes Intravenous  Once 11/08/18 1143 11/08/18 1324

## 2018-11-08 NOTE — ED Notes (Signed)
Rt at bedside. Dr. Darnelle Catalan wants to try bipap at this time instead of intubation.

## 2018-11-08 NOTE — ED Notes (Signed)
Lab called to run Covid swab

## 2018-11-08 NOTE — ED Notes (Signed)
Called pharmacy for protonix drip- states they are making it

## 2018-11-08 NOTE — ED Notes (Signed)
Lab stated they would send someone to redraw blue and light green tubes

## 2018-11-08 NOTE — ED Notes (Signed)
Dr Darnelle Catalan notified of critical lactic and INR

## 2018-11-08 NOTE — ED Notes (Signed)
Date and time results received: 11/08/18 1230   Test: ph 6.98, pCO2 >120, p02 72   Name of Provider Notified: Dr. Darnelle Catalan

## 2018-11-08 NOTE — ED Notes (Signed)
Date and time results received: 11/08/18 1145   Test: ph 7.02, pCO2 112, p02 65   Name of Provider Notified: Dr. Darnelle Catalan

## 2018-11-08 NOTE — ED Notes (Signed)
bair hugger applied.

## 2018-11-08 NOTE — ED Notes (Signed)
Date and time results received: 11/08/18 1405   Test: ph 7.14, C02 84, p02 61, bicarb 29.9   Name of Provider Notified: Dr. Darnelle Catalan

## 2018-11-08 NOTE — ED Notes (Signed)
Dr Darnelle Catalan notified of critical potassium- no new orders at this time

## 2018-11-08 NOTE — ED Notes (Signed)
ABG drawn by Bambi RT, L wrist.

## 2018-11-08 NOTE — ED Notes (Signed)
Recollect of light green and blue top at this time. Corona swab walked to lab by Dorian EDT. 2nd EKG performed at this time.

## 2018-11-08 NOTE — ED Notes (Signed)
Dr. Darnelle Catalan at bedside to place a central line.

## 2018-11-08 NOTE — ED Triage Notes (Signed)
Arrived to ED via [facility] emergency traffic from home found unresponsive last night by family and left  found unresponsive again this AM pinpoint pupils 5mg  narcan just starting to arouse upon ED arrival hx asthma, emphasema, hypotension CBG 93 Moaning upon arrival

## 2018-11-08 NOTE — ED Notes (Signed)
3rd bag of Levophed started.

## 2018-11-08 NOTE — ED Notes (Signed)
3rd EKG printed and given to Dr. Darnelle Catalan. VO to repeat from Dr. Darnelle Catalan.

## 2018-11-08 NOTE — H&P (Addendum)
[facility] [place] [facility]   PATIENT NAME: Angela Berry    MR#:  [id number]  DATE OF BIRTH:  Mar 05, 1951  DATE OF ADMISSION:  11/08/2018  PRIMARY CARE PHYSICIAN: Martie Round, NP   REQUESTING/REFERRING PHYSICIAN: Dorothea Glassman  CHIEF COMPLAINT:   Chief Complaint  Patient presents with   Unresponsive    HISTORY OF PRESENT ILLNESS:  Angela Berry  is a 68 y.o. female with a known history of COPD and asthma who was brought into the emergency room by EMS with report of passing out after drinking some wine the night before.  EMS reported patient is staying with someone who found her unresponsive last night but EMS was not called until this morning.  EMS reported patient having pinpoint pupils.  Was given 5 mg of Narcan.  Patient woke up and became confused and agitated.  On arrival in the emergency room patient had to be given 5 mg of Haldol and 1 mg of Ativan due to agitation.  Laboratory studies done significant for hypokalemia with potassium of 6.4.  Chest x-ray with findings suggestive of CHF initially.  Patient was hypothermic and had to be placed on warming blanket.  Also became hypotensive.  Received over 1 to 2 L of IV fluid in the emergency room which was initially discontinued due to concern for CHF.  Blood gas results reviewed with evidence of acute hypercapnic respiratory failure.  Patient had to be intubated in the emergency room.  CT scan of the head done with no evidence of acute intracranial abnormalities.  Could not exclude acute sinusitis however.  Patient was started on broad-spectrum IV antibiotics with IV vancomycin and cefepime.  Central line with eft femoral line was placed by emergency room provider.  Patient requiring Levophed drip.  Initially there was no bed available in the ICU.  I was later notified that her bed was open this evening and patient being admitted to the ICU for further evaluation and management.  Emergency room provider had  discussed case with critical care physician at [facility] previously.  Patient already started on bicarb drip and Protonix drip.  Repeat potassium level already trending down to 5.6.  PAST MEDICAL HISTORY:   Past Medical History:  Diagnosis Date   Asthma    COPD (chronic obstructive pulmonary disease) (HCC)    Dyspnea     PAST SURGICAL HISTORY:   Past Surgical History:  Procedure Laterality Date   CRANIOPLASTY  1976   EXPLORATORY LAPAROTOMY     HYSTERECTOMY ABDOMINAL WITH SALPINGECTOMY      SOCIAL HISTORY:   Social History   Tobacco Use   Smoking status: Current Every Day Smoker    Packs/day: 1.00    Years: 40.00    Pack years: 40.00    Types: Cigarettes   Smokeless tobacco: Never Used  Substance Use Topics   Alcohol use: Yes    Comment: occasional    FAMILY HISTORY:  History reviewed. No pertinent family history.  DRUG ALLERGIES:  No Known Allergies  REVIEW OF SYSTEMS:   ROS unobtainable due to patient's medical condition.  MEDICATIONS AT HOME:   Prior to Admission medications   Medication Sig Start Date End Date Taking? Authorizing Provider  amLODipine (NORVASC) 5 MG tablet Take 5 mg by mouth daily. 10/07/18  Yes [provider]  lisinopril (PRINIVIL,ZESTRIL) 40 MG tablet Take 40 mg by mouth daily.    Yes [provider]  metoprolol succinate (TOPROL-XL) 50 MG 24 hr tablet  Take 50 mg by mouth daily.    Yes [provider]  albuterol (PROVENTIL HFA) 108 (90 Base) MCG/ACT inhaler Inhale into the lungs.    [provider]  budesonide-formoterol (SYMBICORT) 160-4.5 MCG/ACT inhaler Inhale into the lungs. 03/12/17 03/12/18  [provider]  cetirizine (ZYRTEC) 10 MG tablet Take 10 mg by mouth daily.    [provider]  ipratropium (ATROVENT HFA) 17 MCG/ACT inhaler Inhale into the lungs.    [provider]  ipratropium-albuterol (DUONEB) 0.5-2.5 (3) MG/3ML SOLN  03/04/17   [provider]        VITAL SIGNS:  Blood pressure (!) 148/68, pulse (!) 119, temperature 98.1 F (36.7 C), temperature source Rectal, resp. rate (!) 30, weight 80 kg, SpO2 94 %.  PHYSICAL EXAMINATION:  Physical Exam  GENERAL:  68 y.o.-year-old patient lying in the bed.  Patient sedated on the vent EYES: Pupils equal, round, reactive to light and accommodation. No scleral icterus. Extraocular muscles intact.  HEENT: Head atraumatic, normocephalic. Oropharynx and nasopharynx clear.  NECK:  Supple, no jugular venous distention. No thyroid enlargement, no tenderness.  LUNGS: Minimal rales bilaterally.  Patient currently sedated on the vent.  CARDIOVASCULAR: S1, S2 normal. No murmurs, rubs, or gallops.  ABDOMEN: Soft, nontender, nondistended. Bowel sounds present. No organomegaly or mass.  EXTREMITIES: No pedal edema, cyanosis, or clubbing.  NEUROLOGIC: Patient sedated on the vent.  Neuro exam not done at this time  PSYCHIATRIC: The patient sedated on the vent SKIN: No obvious rash, lesion, or ulcer.   LABORATORY PANEL:   CBC Recent Labs  Lab 11/08/18 1028  WBC 15.2*  HGB 15.7*  HCT 49.6*  PLT 287   ------------------------------------------------------------------------------------------------------------------  Chemistries  Recent Labs  Lab 11/08/18 1250 11/08/18 1510  NA 130* 131*  K 6.4* 5.6*  CL 92* 93*  CO2 24 24  GLUCOSE 121* 153*  BUN 14 16  CREATININE 1.39* 1.36*  CALCIUM 7.5* 7.9*  AST 1,305*  --   ALT 966*  --   ALKPHOS 72  --   BILITOT 1.3*  --    ------------------------------------------------------------------------------------------------------------------  Cardiac Enzymes Recent Labs  Lab 11/08/18 1250  TROPONINI <0.03   ------------------------------------------------------------------------------------------------------------------  RADIOLOGY:  Ct Head Wo Contrast  Result Date: 11/08/2018 CLINICAL DATA:  Found unresponsive. EXAM: CT HEAD WITHOUT  CONTRAST TECHNIQUE: Contiguous axial images were obtained from the base of the skull through the vertex without intravenous contrast. COMPARISON:  None. FINDINGS: Brain: Limited motion degraded scan. No evidence of parenchymal hemorrhage or extra-axial fluid collection. No mass lesion, mass effect, or midline shift. No CT evidence of acute infarction. Cerebral volume is age appropriate. No ventriculomegaly. Vascular: No acute abnormality. Skull: No evidence of calvarial fracture. Sinuses/Orbits: Fluid level in the right maxillary sinus. Mucoperiosteal thickening in the ethmoidal air cells and maxillary sinuses. Other:  The mastoid air cells are unopacified. IMPRESSION: 1. Limited motion degraded scan. 2. No evidence of acute intracranial abnormality. No evidence of calvarial fracture. 3. Mucoperiosteal thickening in paranasal sinuses with fluid level in the right maxillary sinus, cannot exclude acute sinusitis. Electronically Signed   By: Delbert PhenixJason A Poff M.D.   On: 11/08/2018 12:25   Dg Chest Portable 1 View  Result Date: 11/08/2018 CLINICAL DATA:  Respiratory difficulty EXAM: PORTABLE CHEST 1 VIEW COMPARISON:  1013 hours today FINDINGS: Endotracheal tube placed. Tip is 4.7 cm from the carina. NG tube placed with its tip beyond the gastric fundus. Stable vascular congestion. Basilar interstitial edema has improved. No pneumothorax. IMPRESSION: Endotracheal  and NG tubes placed in appropriate position. Improving interstitial edema. Electronically Signed   By: Jolaine Click M.D.   On: 11/08/2018 13:20   Dg Chest Portable 1 View  Result Date: 11/08/2018 CLINICAL DATA:  Unresponsive EXAM: PORTABLE CHEST 1 VIEW COMPARISON:  08/30/2012 FINDINGS: The cardiac silhouette is accentuated by AP technique. It is likely normal in size. Vascular congestion. Mild interstitial edema is suspected with prominent interstitial markings towards the lung bases. No pneumothorax or pleural effusion. IMPRESSION: Vascular congestion and  basilar interstitial edema. Electronically Signed   By: Jolaine Click M.D.   On: 11/08/2018 10:41   Dg Abd Portable 1 View  Result Date: 11/08/2018 CLINICAL DATA:  Evaluate OG tube placement EXAM: PORTABLE ABDOMEN - 1 VIEW COMPARISON:  Chest x-ray Nov 08, 2018 FINDINGS: The OG tube terminates between 2 and 3 cm below the GE junction. The distal tip is in the body of the stomach. IMPRESSION: The OG tube is in good position. Electronically Signed   By: Gerome Sam III M.D   On: 11/08/2018 13:22      IMPRESSION AND PLAN:  Patient is a 68 year old female with history of asthma and COPD being admitted to the ICU for further management of acute hypercapnic respiratory failure  1.  Acute hypercapnic respiratory failure Patient with known history of COPD and asthma. Patient had to be intubated in the emergency room due to evidence of hypercapnic respiratory failure on ABG and patient with decreased level of responsiveness. Being admitted to the ICU for further evaluation and management. Patient currently intubated and sedated on the vent.  2.  Hyperkalemia with potassium of 6.4 Treated in the emergency room.  Repeat potassium level down to 5.6  3.  Septic shock Source of sepsis not yet found. Patient was hypotensive and hypothermic.  Requiring Levophed drip at this time and warming blanket. Placed on broad-spectrum IV antibiotics with vancomycin and cefepime. IV fluid hydration with bicarb drip as recommended by critical care physician from [facility] Patient would likely benefit from diuresis with IV Lasix in a.m. once hemodynamically stable given findings of vascular congestion on chest x-ray.  2D echocardiogram already ordered to evaluate cardiac function  4.  Acute metabolic encephalopathy Secondary to hypercapnic respiratory failure.  CT scan of the head with no acute findings. Monitor clinically  5.  Acute kidney injury Being hydrated with IV fluids.  Follow-up on renal function in  a.m.  6.  Coagulopathy.  INR will 5.8. Likely related to septic shock.  Clinically no evidence of bleeding so far. Continue management of septic shock as outlined above.  Follow-up on INR in a.m.  DVT prophylaxis ; SCDs  No heparin products due to elevated INR of 5.8.  all the records are reviewed and case discussed with ED provider. I called patient's contact listed in the chart Ms. Sande Rives, no response.  CODE STATUS: Full code Patient not awake and alert.  Sedated on the vent and unable to make her own medical decision.  Unable to reach family member listed in chart.  Patient is currently full code until family members available  TOTAL TIME TAKING CARE OF THIS PATIENT: 65 minutes.    Shiloh Southern M.D on 11/08/2018 at 6:18 PM  Between 7am to 6pm - Pager - [phone]  After 6pm go to [email or web address] - Social research officer, government  [facility]  Office  [phone]  CC: Primary care physician; Martie Round, NP   Note: This dictation was prepared with Dragon dictation  along with smaller phrase technology. Any transcriptional errors that result from this process are unintentional.

## 2018-11-08 NOTE — Progress Notes (Signed)
Pharmacy Antibiotic Note  Angela Berry is a 68 y.o. female admitted on 11/08/2018 with sepsis.  Pharmacy has been consulted for vanc/cefepime dosing. Patient received vanc 1g, cefepime 2g IV x 1  Plan: Considering patient didn't received a load will start maintenance now since patient should be at a concentration of 15 mcg/mL 8 hours post vanc 1g  Vancomycin 500 mg IV Q 24 hrs. Goal AUC 400-550. Expected AUC: 430.0 SCr used: 1.36 Cssmin: 12.1 Will continue cefepime 2g IV q12h per CrCl 30 - 60 ml/min will continue to monitor renal function and s/sx of infx.  Height: 5' 2.52" (158.8 cm) Weight: 159 lb 9.8 oz (72.4 kg) IBW/kg (Calculated) : 51.3  Temp (24hrs), Avg:97.1 F (36.2 C), Min:93.8 F (34.3 C), Max:100.3 F (37.9 C)  Recent Labs  Lab 11/08/18 1028 11/08/18 1138 11/08/18 1250 11/08/18 1355 11/08/18 1510  WBC 15.2*  --   --   --   --   CREATININE  --   --  1.39*  --  1.36*  LATICACIDVEN  --  2.4*  --  2.1*  --     Estimated Creatinine Clearance: 38.9 mL/min (A) (by C-G formula based on SCr of 1.36 mg/dL (H)).    No Known Allergies  Thank you for allowing pharmacy to be a part of this patient's care.  Thomasene Ripple, PharmD, BCPS Clinical Pharmacist 11/08/2018 10:23 PM

## 2018-11-08 NOTE — ED Notes (Signed)
Pt placed on non-rebreather at 15 L. RT called to bedside to assist in intubation with Dr. Darnelle Catalan

## 2018-11-08 NOTE — ED Notes (Addendum)
Charon Burgen (brother) [phone]  He spoke to Dr. Darnelle Catalan and received update on pt.

## 2018-11-08 NOTE — ED Notes (Signed)
ED TO INPATIENT HANDOFF REPORT  ED Nurse Name and Phone #:  Jae Dire and Ariel [phone]  S Name/Age/Gender Angela Berry 68 y.o. female Room/Bed: ED26A/ED26A  Code Status   Code Status: Full Code  Home/SNF/Other Home {Patient oriented to: unresponsive  Is this baseline? No   Triage Complete: Triage complete  Chief Complaint Unresponsive  Triage Note Arrived to ED via [facility] emergency traffic from home found unresponsive last night by family and left  found unresponsive again this AM pinpoint pupils 5mg  narcan just starting to arouse upon ED arrival hx asthma, emphasema, hypotension CBG 93 Moaning upon arrival    Allergies No Known Allergies  Level of Care/Admitting Diagnosis ED Disposition    ED Disposition Condition Comment   Admit  Hospital Area: [facility] [[id number]]  Level of Care: ICU [6]  Covid Evaluation: Confirmed COVID Negative  Diagnosis: Acute hypercapnic respiratory failure [facility]) [[id number]]  Admitting Physician: Jama Flavors [[id number]]  Attending Physician: Jama Flavors [[id number]]  Estimated length of stay: 3 - 4 days  Certification:: I certify this patient will need inpatient services for at least 2 midnights  PT Class (Do Not Modify): Inpatient [101]  PT Acc Code (Do Not Modify): Private [1]       B Medical/Surgery History Past Medical History:  Diagnosis Date  . Asthma   . COPD (chronic obstructive pulmonary disease) (HCC)   . Dyspnea    Past Surgical History:  Procedure Laterality Date  . CRANIOPLASTY  1976  . EXPLORATORY LAPAROTOMY    . HYSTERECTOMY ABDOMINAL WITH SALPINGECTOMY       A IV Location/Drains/Wounds Patient Lines/Drains/Airways Status   Active Line/Drains/Airways    Name:   Placement date:   Placement time:   Site:   Days:   Peripheral IV 11/08/18 Right Forearm   11/08/18    1026    Forearm   less than 1   Peripheral IV 11/08/18 Right Hand   11/08/18    1055    Hand   less than 1   Peripheral IV 11/08/18 Right  Antecubital   11/08/18    1055    Antecubital   less than 1   Peripheral IV 11/08/18 Left Antecubital   11/08/18    1105    Antecubital   less than 1   CVC Triple Lumen 11/08/18 Left Femoral   11/08/18    1724     less than 1   NG/OG Tube Orogastric 18 Fr. Center mouth Xray;Aucultation Documented cm marking at nare/ corner of mouth 55 cm   11/08/18    1247    Center mouth   less than 1   Urethral Catheter Jae Dire RN Latex 14 Fr.   11/08/18    1244    Latex   less than 1   Airway 7 mm   11/08/18    1245     less than 1          Intake/Output Last 24 hours  Intake/Output Summary (Last 24 hours) at 11/08/2018 1818 Last data filed at 11/08/2018 1510 Gross per 24 hour  Intake 2463.56 ml  Output -  Net 2463.56 ml    Labs/Imaging Results for orders placed or performed during the hospital encounter of 11/08/18 (from the past 48 hour(s))  CBC     Status: Abnormal   Collection Time: 11/08/18 10:28 AM  Result Value Ref Range   WBC 15.2 (H) 4.0 - 10.5 K/uL   RBC 5.08 3.87 - 5.11 MIL/uL  Hemoglobin 15.7 (H) 12.0 - 15.0 g/dL   HCT 16.1 (H) 09.6 - 04.5 %   MCV 97.6 80.0 - 100.0 fL   MCH 30.9 26.0 - 34.0 pg   MCHC 31.7 30.0 - 36.0 g/dL   RDW 40.9 (H) 81.1 - 91.4 %   Platelets 287 150 - 400 K/uL   nRBC 0.3 (H) 0.0 - 0.2 %    Comment: Performed at [facility], [address] [place], [place] [address]  Brain natriuretic peptide     Status: Abnormal   Collection Time: 11/08/18 10:28 AM  Result Value Ref Range   B Natriuretic Peptide 623.0 (H) 0.0 - 100.0 pg/mL    Comment: Performed at [facility], [address] [place], [place] [address]  Ethanol     Status: None   Collection Time: 11/08/18 10:28 AM  Result Value Ref Range   Alcohol, Ethyl (B) <10 <10 mg/dL    Comment: (NOTE) Lowest detectable limit for serum alcohol is 10 mg/dL. For medical purposes only. Performed at [facility], [address] [place], [place] [address]   Salicylate level      Status: None   Collection Time: 11/08/18 10:28 AM  Result Value Ref Range   Salicylate Lvl <7.0 2.8 - 30.0 mg/dL    Comment: HEMOLYSIS AT THIS LEVEL MAY AFFECT RESULT Performed at [facility], [address] [place], [place] [address]   Acetaminophen level     Status: Abnormal   Collection Time: 11/08/18 10:28 AM  Result Value Ref Range   Acetaminophen (Tylenol), Serum <10 (L) 10 - 30 ug/mL    Comment: (NOTE) Therapeutic concentrations vary significantly. A range of 10-30 ug/mL  may be an effective concentration for many patients. However, some  are best treated at concentrations outside of this range. Acetaminophen concentrations >150 ug/mL at 4 hours after ingestion  and >50 ug/mL at 12 hours after ingestion are often associated with  toxic reactions. Performed at [facility], [address] [place], [place] [address]   Blood gas, arterial ([facility] & AP ONLY)     Status: Abnormal   Collection Time: 11/08/18 10:35 AM  Result Value Ref Range   FIO2 0.60    Delivery systems BILEVEL POSITIVE AIRWAY PRESSURE    Inspiratory PAP 18    Expiratory PAP 10    pH, Arterial 7.02 (LL) 7.350 - 7.450    Comment: CRITICAL RESULT CALLED TO, READ BACK BY AND VERIFIED WITH: KATE RN AT 1138 ON 11/08/18 BL    pCO2 arterial 112 (HH) 32.0 - 48.0 mmHg    Comment: CRITICAL RESULT CALLED TO, READ BACK BY AND VERIFIED WITH: KATE RN ON 1137 ON 11/08/18 BL    pO2, Arterial 65 (L) 83.0 - 108.0 mmHg   Bicarbonate 26.4 20.0 - 28.0 mmol/L   Acid-base deficit 7.9 (H) 0.0 - 2.0 mmol/L   O2 Saturation 87.6 %   Patient temperature 33.3    Collection site LEFT RADIAL    Sample type ARTERIAL DRAW    Allens test (pass/fail) PASS PASS    Comment: Performed at [facility], [address] [place], [place] [address]  Glucose, capillary     Status: Abnormal   Collection Time: 11/08/18 10:37 AM  Result Value Ref Range   Glucose-Capillary 111 (H) 70 - 99 mg/dL  Protime-INR     Status:  Abnormal   Collection Time: 11/08/18 11:22 AM  Result Value Ref Range   Prothrombin Time 50.9 (H) 11.4 -  15.2 seconds   INR 5.8 (HH) 0.8 - 1.2    Comment: RESULT REPEATED AND VERIFIED CRITICAL RESULT CALLED TO, READ BACK BY AND VERIFIED WITH: ARIEL WALLACE AT 1210 11/08/2018.PMF (NOTE) INR goal varies based on device and disease states. Performed at [facility], [address] [place], [place] [address]   APTT     Status: Abnormal   Collection Time: 11/08/18 11:22 AM  Result Value Ref Range   aPTT 81 (H) 24 - 36 seconds    Comment:        IF BASELINE aPTT IS ELEVATED, SUGGEST PATIENT RISK ASSESSMENT BE USED TO DETERMINE APPROPRIATE ANTICOAGULANT THERAPY. Performed at [facility], [address] [place], [place] [address]   Lactic acid, plasma     Status: Abnormal   Collection Time: 11/08/18 11:38 AM  Result Value Ref Range   Lactic Acid, Venous 2.4 (HH) 0.5 - 1.9 mmol/L    Comment: CRITICAL RESULT CALLED TO, READ BACK BY AND VERIFIED WITH ARIEL WALLACE AT 1202 ON 11/08/2018 [facility]. Performed at [facility], [address] [place], [place] [address]   Blood gas, arterial ([facility] & [facility] ONLY)     Status: Abnormal   Collection Time: 11/08/18 12:04 PM  Result Value Ref Range   FIO2 0.70    Delivery systems BILEVEL POSITIVE AIRWAY PRESSURE    LHR 30 resp/min   Inspiratory PAP 18    Expiratory PAP 10    pH, Arterial 6.98 (LL) 7.350 - 7.450    Comment: CRITICAL RESULT CALLED TO, READ BACK BY AND VERIFIED WITH: BRANDY RN AT 12.29 ON 11/08/18 BL    pCO2 arterial >120.0 (HH) 32.0 - 48.0 mmHg    Comment: CRITICAL RESULT CALLED TO, READ BACK BY AND VERIFIED WITH: BRANDY RN AT 12:29 ON 11/08/18 BL    pO2, Arterial 72 (L) 83.0 - 108.0 mmHg   Bicarbonate 26.8 20.0 - 28.0 mmol/L   Acid-base deficit 8.3 (H) 0.0 - 2.0 mmol/L   O2 Saturation 87.9 %   Patient temperature 34.4    Collection site LEFT RADIAL    Sample type ARTERIAL DRAW    Allens test  (pass/fail) PASS PASS    Comment: Performed at [facility], [address] [place], [place] [address]  SARS Coronavirus 2 (CEPHEID - Performed in [facility] hospital lab), Hosp Order     Status: None   Collection Time: 11/08/18 12:22 PM  Result Value Ref Range   SARS Coronavirus 2 NEGATIVE NEGATIVE    Comment: (NOTE) If result is NEGATIVE SARS-CoV-2 target nucleic acids are NOT DETECTED. The SARS-CoV-2 RNA is generally detectable in upper and lower  respiratory specimens during the acute phase of infection. The lowest  concentration of SARS-CoV-2 viral copies this assay can detect is 250  copies / mL. A negative result does not preclude SARS-CoV-2 infection  and should not be used as the sole basis for treatment or other  patient management decisions.  A negative result may occur with  improper specimen collection / handling, submission of specimen other  than nasopharyngeal swab, presence of viral mutation(s) within the  areas targeted by this assay, and inadequate number of viral copies  (<250 copies / mL). A negative result must be combined with clinical  observations, patient history, and epidemiological information. If result is POSITIVE SARS-CoV-2 target nucleic acids are DETECTED. The SARS-CoV-2 RNA is generally detectable in upper and lower  respiratory specimens dur ing the acute phase of infection.  Positive  results are indicative of active infection with SARS-CoV-2.  Clinical  correlation with patient history and other diagnostic information is  necessary to determine patient infection status.  Positive results do  not rule out bacterial infection or co-infection with other viruses. If result is PRESUMPTIVE POSTIVE SARS-CoV-2 nucleic acids MAY BE PRESENT.   A presumptive positive result was obtained on the submitted specimen  and confirmed on repeat testing.  While 2019 novel coronavirus  (SARS-CoV-2) nucleic acids may be present in the submitted sample   additional confirmatory testing may be necessary for epidemiological  and / or clinical management purposes  to differentiate between  SARS-CoV-2 and other Sarbecovirus currently known to infect humans.  If clinically indicated additional testing with an alternate test  methodology [id number]) is advised. The SARS-CoV-2 RNA is generally  detectable in upper and lower respiratory sp ecimens during the acute  phase of infection. The expected result is Negative. Fact Sheet for Patients:  BoilerBrush.com.cy Fact Sheet for Healthcare Providers: https://pope.com/ This test is not yet approved or cleared by the [place] FDA and has been authorized for detection and/or diagnosis of SARS-CoV-2 by FDA under an Emergency Use Authorization (EUA).  This EUA will remain in effect (meaning this test can be used) for the duration of the COVID-19 declaration under Section 564(b)(1) of the Act, 21 U.S.C. section 360bbb-3(b)(1), unless the authorization is terminated or revoked sooner. Performed at [facility], [address] [place], [place] [address]   Comprehensive metabolic panel     Status: Abnormal   Collection Time: 11/08/18 12:50 PM  Result Value Ref Range   Sodium 130 (L) 135 - 145 mmol/L   Potassium 6.4 (HH) 3.5 - 5.1 mmol/L    Comment: CRITICAL RESULT CALLED TO, READ BACK BY AND VERIFIED WITH ARIEL WALLACE AT 1407 ON 11/08/2018 [facility].    Chloride 92 (L) 98 - 111 mmol/L   CO2 24 22 - 32 mmol/L   Glucose, Bld 121 (H) 70 - 99 mg/dL   BUN 14 8 - 23 mg/dL   Creatinine, Ser 0.97 (H) 0.44 - 1.00 mg/dL   Calcium 7.5 (L) 8.9 - 10.3 mg/dL   Total Protein 6.6 6.5 - 8.1 g/dL   Albumin 3.2 (L) 3.5 - 5.0 g/dL   AST 3,532 (H) 15 - 41 U/L   ALT 966 (H) 0 - 44 U/L   Alkaline Phosphatase 72 38 - 126 U/L   Total Bilirubin 1.3 (H) 0.3 - 1.2 mg/dL   GFR calc non Af Amer 40 (L) >60 mL/min   GFR calc Af Amer 46 (L) >60 mL/min   Anion gap 14 5  - 15    Comment: Performed at [facility], [address] [place], [place] [address]  Troponin I -     Status: None   Collection Time: 11/08/18 12:50 PM  Result Value Ref Range   Troponin I <0.03 <0.03 ng/mL    Comment: Performed at [facility], [address] [place], [place] [address]  TSH     Status: None   Collection Time: 11/08/18 12:50 PM  Result Value Ref Range   TSH 2.136 0.350 - 4.500 uIU/mL    Comment: Performed by a 3rd Generation assay with a functional sensitivity of <=0.01 uIU/mL. Performed at [facility], [address] [place], [place] [address]   Blood gas, arterial     Status: Abnormal   Collection Time: 11/08/18  1:39 PM  Result Value Ref Range   FIO2 0.40  Delivery systems VENTILATOR    Mode ASSIST CONTROL    VT 400 mL   LHR 30 resp/min   Peep/cpap 8.0 cm H20   pH, Arterial 7.14 (LL) 7.350 - 7.450    Comment: CRITICAL RESULT CALLED TO, READ BACK BY AND VERIFIED WITH: ARIEL RN AT 1403 ON 11/08/18 BL    pCO2 arterial 94 (HH) 32.0 - 48.0 mmHg    Comment: CRITICAL RESULT CALLED TO, READ BACK BY AND VERIFIED WITH: ARIEL RN AT 1403 ON 11/08/18 BL    pO2, Arterial 61 (L) 83.0 - 108.0 mmHg   Bicarbonate 29.9 (H) 20.0 - 28.0 mmol/L   Acid-base deficit 2.2 (H) 0.0 - 2.0 mmol/L   O2 Saturation 87.5 %   Patient temperature 34.4    Collection site RIGHT RADIAL    Sample type ARTERIAL DRAW    Allens test (pass/fail) PASS PASS    Comment: Performed at [facility], [address] [place], [place] [address]  Lactic acid, plasma     Status: Abnormal   Collection Time: 11/08/18  1:55 PM  Result Value Ref Range   Lactic Acid, Venous 2.1 (HH) 0.5 - 1.9 mmol/L    Comment: CRITICAL RESULT CALLED TO, READ BACK BY AND VERIFIED WITH KATE Pearla Mckinny AT 1431 ON 11/08/2018 [facility]. Performed at [facility], [address] [place], [place] [address]   Basic metabolic panel     Status: Abnormal   Collection Time: 11/08/18   3:10 PM  Result Value Ref Range   Sodium 131 (L) 135 - 145 mmol/L   Potassium 5.6 (H) 3.5 - 5.1 mmol/L   Chloride 93 (L) 98 - 111 mmol/L   CO2 24 22 - 32 mmol/L   Glucose, Bld 153 (H) 70 - 99 mg/dL   BUN 16 8 - 23 mg/dL   Creatinine, Ser 0.98 (H) 0.44 - 1.00 mg/dL   Calcium 7.9 (L) 8.9 - 10.3 mg/dL   GFR calc non Af Amer 41 (L) >60 mL/min   GFR calc Af Amer 47 (L) >60 mL/min   Anion gap 14 5 - 15    Comment: Performed at [facility], [address] [place], [place] [address]  Procalcitonin - Baseline     Status: None   Collection Time: 11/08/18  3:10 PM  Result Value Ref Range   Procalcitonin 0.51 ng/mL    Comment:        Interpretation: PCT > 0.5 ng/mL and <= 2 ng/mL: Systemic infection (sepsis) is possible, but other conditions are known to elevate PCT as well. (NOTE)       Sepsis PCT Algorithm           Lower Respiratory Tract                                      Infection PCT Algorithm    ----------------------------     ----------------------------         PCT < 0.25 ng/mL                PCT < 0.10 ng/mL         Strongly encourage             Strongly discourage   discontinuation of antibiotics    initiation of antibiotics    ----------------------------     -----------------------------       PCT 0.25 - 0.50 ng/mL  PCT 0.10 - 0.25 ng/mL               OR       >80% decrease in PCT            Discourage initiation of                                            antibiotics      Encourage discontinuation           of antibiotics    ----------------------------     -----------------------------         PCT >= 0.50 ng/mL              PCT 0.26 - 0.50 ng/mL                AND       <80% decrease in PCT             Encourage initiation of                                             antibiotics       Encourage continuation           of antibiotics    ----------------------------     -----------------------------        PCT >= 0.50 ng/mL                   PCT > 0.50 ng/mL               AND         increase in PCT                  Strongly encourage                                      initiation of antibiotics    Strongly encourage escalation           of antibiotics                                     -----------------------------                                           PCT <= 0.25 ng/mL                                                 OR                                        > 80% decrease in PCT  Discontinue / Do not initiate                                             antibiotics Performed at [facility], [address] [place], [place] [address]   Urinalysis, Routine w reflex microscopic     Status: Abnormal   Collection Time: 11/08/18  3:29 PM  Result Value Ref Range   Color, Urine AMBER (A) YELLOW    Comment: BIOCHEMICALS MAY BE AFFECTED BY COLOR   APPearance CLOUDY (A) CLEAR   Specific Gravity, Urine 1.021 1.005 - 1.030   pH 5.0 5.0 - 8.0   Glucose, UA NEGATIVE NEGATIVE mg/dL   Hgb urine dipstick NEGATIVE NEGATIVE   Bilirubin Urine NEGATIVE NEGATIVE   Ketones, ur 5 (A) NEGATIVE mg/dL   Protein, ur 811 (A) NEGATIVE mg/dL   Nitrite NEGATIVE NEGATIVE   Leukocytes,Ua NEGATIVE NEGATIVE   RBC / HPF 0-5 0 - 5 RBC/hpf   WBC, UA 0-5 0 - 5 WBC/hpf   Bacteria, UA NONE SEEN NONE SEEN   Squamous Epithelial / LPF 0-5 0 - 5   Mucus PRESENT    Hyaline Casts, UA PRESENT     Comment: Performed at [facility], [address] [place], [place] [address]   Ct Head Wo Contrast  Result Date: 11/08/2018 CLINICAL DATA:  Found unresponsive. EXAM: CT HEAD WITHOUT CONTRAST TECHNIQUE: Contiguous axial images were obtained from the base of the skull through the vertex without intravenous contrast. COMPARISON:  None. FINDINGS: Brain: Limited motion degraded scan. No evidence of parenchymal hemorrhage or extra-axial fluid collection. No mass lesion, mass effect, or midline shift. No CT  evidence of acute infarction. Cerebral volume is age appropriate. No ventriculomegaly. Vascular: No acute abnormality. Skull: No evidence of calvarial fracture. Sinuses/Orbits: Fluid level in the right maxillary sinus. Mucoperiosteal thickening in the ethmoidal air cells and maxillary sinuses. Other:  The mastoid air cells are unopacified. IMPRESSION: 1. Limited motion degraded scan. 2. No evidence of acute intracranial abnormality. No evidence of calvarial fracture. 3. Mucoperiosteal thickening in paranasal sinuses with fluid level in the right maxillary sinus, cannot exclude acute sinusitis. Electronically Signed   By: Delbert Phenix M.D.   On: 11/08/2018 12:25   Dg Chest Portable 1 View  Result Date: 11/08/2018 CLINICAL DATA:  Respiratory difficulty EXAM: PORTABLE CHEST 1 VIEW COMPARISON:  1013 hours today FINDINGS: Endotracheal tube placed. Tip is 4.7 cm from the carina. NG tube placed with its tip beyond the gastric fundus. Stable vascular congestion. Basilar interstitial edema has improved. No pneumothorax. IMPRESSION: Endotracheal and NG tubes placed in appropriate position. Improving interstitial edema. Electronically Signed   By: Jolaine Click M.D.   On: 11/08/2018 13:20   Dg Chest Portable 1 View  Result Date: 11/08/2018 CLINICAL DATA:  Unresponsive EXAM: PORTABLE CHEST 1 VIEW COMPARISON:  08/30/2012 FINDINGS: The cardiac silhouette is accentuated by AP technique. It is likely normal in size. Vascular congestion. Mild interstitial edema is suspected with prominent interstitial markings towards the lung bases. No pneumothorax or pleural effusion. IMPRESSION: Vascular congestion and basilar interstitial edema. Electronically Signed   By: Jolaine Click M.D.   On: 11/08/2018 10:41   Dg Abd Portable 1 View  Result Date: 11/08/2018 CLINICAL DATA:  Evaluate OG tube placement EXAM: PORTABLE ABDOMEN - 1 VIEW COMPARISON:  Chest x-ray Nov 08, 2018 FINDINGS: The OG tube terminates  between 2 and 3 cm below the  GE junction. The distal tip is in the body of the stomach. IMPRESSION: The OG tube is in good position. Electronically Signed   By: Gerome Sam III M.D   On: 11/08/2018 13:22    Pending Labs Unresulted Labs (From admission, onward)    Start     Ordered   11/09/18 0500  Procalcitonin  Daily,   STAT     11/08/18 1549   11/09/18 0500  Protime-INR  Tomorrow morning,   STAT     11/08/18 1809   11/09/18 0500  Cortisol-am, blood  Tomorrow morning,   STAT     11/08/18 1809   11/09/18 0500  Basic metabolic panel  Tomorrow morning,   STAT     11/08/18 1812   11/09/18 0500  CBC  Tomorrow morning,   STAT     11/08/18 1812   11/09/18 0500  Magnesium  Tomorrow morning,   STAT     11/08/18 1812   11/09/18 0500  Phosphorus  Tomorrow morning,   STAT     11/08/18 1812   11/08/18 2100  Troponin I - Once  Once,   STAT     11/08/18 1812   11/08/18 1811  Troponin I - Once  Once,   STAT     11/08/18 1812   11/08/18 1808  HIV antibody (Routine Testing)  Once,   STAT     11/08/18 1809   11/08/18 1132  Blood culture (routine x 2)  BLOOD CULTURE X 2,   STAT     11/08/18 1131   Signed and Held  SARS Coronavirus 2 (CEPHEID - Performed in [facility] hospital lab), [employer]  Once,   R    Comments:  No isolation needed for this testing (if isolation ordered for another indication, maintain current isolation).   Question:  Pre-procedural testing  Answer:  Yes   Signed and Held          Vitals/Pain Today's Vitals   11/08/18 1735 11/08/18 1740 11/08/18 1745 11/08/18 1750  BP: (!) 145/77 137/65 (!) 150/70 (!) 148/68  Pulse: (!) 117 (!) 117 (!) 120 (!) 119  Resp: 20 20 (!) 26 (!) 30  Temp:      TempSrc:      SpO2: 94% 94% 94% 94%  Weight:      PainSc:        Isolation Precautions No active isolations  Medications Medications  norepinephrine (LEVOPHED) 4mg  in premix infusion (35 mcg/min Intravenous New Bag/Given 11/08/18 1800)  propofol (DIPRIVAN) 1000 MG/100ML infusion (45 mcg/kg/min  Intravenous Rate/Dose Change 11/08/18 1642)  pantoprazole (PROTONIX) 80 mg in sodium chloride 0.9 % 250 mL (0.32 mg/mL) infusion (8 mg/hr Intravenous New Bag/Given 11/08/18 1510)  pantoprazole (PROTONIX) injection 40 mg (has no administration in time range)  sodium bicarbonate 150 mEq in dextrose 5 % 1,000 mL infusion ( Intravenous New Bag/Given 11/08/18 1741)  ipratropium-albuterol (DUONEB) 0.5-2.5 (3) MG/3ML nebulizer solution 3 mL (has no administration in time range)  ipratropium-albuterol (DUONEB) 0.5-2.5 (3) MG/3ML nebulizer solution 3 mL (3 mLs Nebulization Given 11/08/18 1021)  ipratropium-albuterol (DUONEB) 0.5-2.5 (3) MG/3ML nebulizer solution 3 mL (3 mLs Nebulization Given 11/08/18 1021)  naloxone (NARCAN) injection 2 mg (2 mg Intravenous Given 11/08/18 1019)  naloxone (NARCAN) injection 2 mg (2 mg Intravenous Given 11/08/18 1023)  haloperidol lactate (HALDOL) injection 5 mg (5 mg Intravenous Given 11/08/18 1029)  haloperidol lactate (HALDOL) injection 5 mg (5 mg Intravenous Given  11/08/18 1043)  LORazepam (ATIVAN) injection 1 mg (1 mg Intravenous Given 11/08/18 1045)  succinylcholine (ANECTINE) injection 100 mg (100 mg Intravenous Given 11/08/18 1240)  sodium chloride 0.9 % bolus 1,000 mL (0 mLs Intravenous Stopped 11/08/18 1116)  sodium chloride 0.9 % bolus 1,000 mL (0 mLs Intravenous Stopped 11/08/18 1343)  naloxone (NARCAN) 2 MG/2ML injection (2 mg  Given 11/08/18 1105)  sodium chloride 0.9 % bolus 1,000 mL (0 mLs Intravenous Stopped 11/08/18 1343)  ceFEPIme (MAXIPIME) 2 g in sodium chloride 0.9 % 100 mL IVPB (0 g Intravenous Stopped 11/08/18 1209)  vancomycin (VANCOCIN) IVPB 1000 mg/200 mL premix (0 mg Intravenous Stopped 11/08/18 1324)  sodium bicarbonate injection 50 mEq (50 mEq Intravenous Given 11/08/18 1240)  midazolam (VERSED) injection 2 mg (2 mg Intravenous Given 11/08/18 1240)  diltiazem (CARDIZEM) injection 10 mg (10 mg Intravenous Given 11/08/18 1308)  pantoprazole (PROTONIX) 80 mg in  sodium chloride 0.9 % 100 mL IVPB (0 mg Intravenous Stopped 11/08/18 1510)  sodium chloride 0.9 % bolus 1,000 mL (1,000 mLs Intravenous New Bag/Given 11/08/18 1543)    Mobility Walks- assuming she walks but was unresponsive when she arrived High fall risk   Focused Assessments    R Recommendations: See Admitting Provider Note  Report given to:   Additional Notes:

## 2018-11-09 ENCOUNTER — Inpatient Hospital Stay (HOSPITAL_COMMUNITY)
Admit: 2018-11-09 | Discharge: 2018-11-09 | Disposition: A | Discharge status: Home / Self Care | Payer: Medicare HMO | Attending: Internal Medicine | Admitting: Internal Medicine

## 2018-11-09 ENCOUNTER — Inpatient Hospital Stay: Payer: Medicare HMO

## 2018-11-09 ENCOUNTER — Other Ambulatory Visit: Payer: Self-pay

## 2018-11-09 DIAGNOSIS — J9602 Acute respiratory failure with hypercapnia: Secondary | ICD-10-CM

## 2018-11-09 LAB — BLOOD CULTURE ID PANEL (REFLEXED)

## 2018-11-09 LAB — PROTIME-INR
INR: 1.5 — ABNORMAL HIGH (ref 0.8–1.2)
Prothrombin Time: 17.8 seconds — ABNORMAL HIGH (ref 11.4–15.2)

## 2018-11-09 LAB — CBC
HCT: 39.9 % (ref 36.0–46.0)
Hemoglobin: 12.8 g/dL (ref 12.0–15.0)
MCH: 30.5 pg (ref 26.0–34.0)
MCHC: 32.1 g/dL (ref 30.0–36.0)
MCV: 95.2 fL (ref 80.0–100.0)
Platelets: 233 10*3/uL (ref 150–400)
RBC: 4.19 MIL/uL (ref 3.87–5.11)
RDW: 16.6 % — ABNORMAL HIGH (ref 11.5–15.5)
WBC: 15.9 10*3/uL — ABNORMAL HIGH (ref 4.0–10.5)
nRBC: 0.2 % (ref 0.0–0.2)

## 2018-11-09 LAB — HEPATIC FUNCTION PANEL
ALT: 776 U/L — ABNORMAL HIGH (ref 0–44)
AST: 1046 U/L — ABNORMAL HIGH (ref 15–41)
Albumin: 1.4 g/dL — ABNORMAL LOW (ref 3.5–5.0)
Alkaline Phosphatase: 38 U/L (ref 38–126)
Bilirubin, Direct: 0.3 mg/dL — ABNORMAL HIGH (ref 0.0–0.2)
Indirect Bilirubin: 0.2 mg/dL — ABNORMAL LOW (ref 0.3–0.9)
Total Bilirubin: 0.5 mg/dL (ref 0.3–1.2)
Total Protein: <3 g/dL — ABNORMAL LOW (ref 6.5–8.1)

## 2018-11-09 LAB — MAGNESIUM: Magnesium: 1.7 mg/dL (ref 1.7–2.4)

## 2018-11-09 LAB — BASIC METABOLIC PANEL
Anion gap: 9 (ref 5–15)
BUN: 17 mg/dL (ref 8–23)
CO2: 32 mmol/L (ref 22–32)
Calcium: 6.6 mg/dL — ABNORMAL LOW (ref 8.9–10.3)
Chloride: 94 mmol/L — ABNORMAL LOW (ref 98–111)
Creatinine, Ser: 1.21 mg/dL — ABNORMAL HIGH (ref 0.44–1.00)
GFR calc Af Amer: 54 mL/min — ABNORMAL LOW (ref >60)
GFR calc non Af Amer: 47 mL/min — ABNORMAL LOW (ref >60)
Glucose, Bld: 228 mg/dL — ABNORMAL HIGH (ref 70–99)
Potassium: 3.1 mmol/L — ABNORMAL LOW (ref 3.5–5.1)
Sodium: 135 mmol/L (ref 135–145)

## 2018-11-09 LAB — PROCALCITONIN: Procalcitonin: 0.92 ng/mL

## 2018-11-09 LAB — ECHOCARDIOGRAM COMPLETE
Height: 62.52 in
Weight: 2553.81 oz

## 2018-11-09 LAB — GLUCOSE, CAPILLARY
Glucose-Capillary: 135 mg/dL — ABNORMAL HIGH (ref 70–99)
Glucose-Capillary: 139 mg/dL — ABNORMAL HIGH (ref 70–99)

## 2018-11-09 LAB — TROPONIN I: Troponin I: 0.04 ng/mL (ref <0.03)

## 2018-11-09 LAB — CORTISOL-AM, BLOOD: Cortisol - AM: 7.3 ug/dL (ref 6.7–22.6)

## 2018-11-09 LAB — PHOSPHORUS: Phosphorus: 1.9 mg/dL — ABNORMAL LOW (ref 2.5–4.6)

## 2018-11-09 MED ORDER — POTASSIUM CHLORIDE 20 MEQ PO PACK
40.0000 meq | PACK | Freq: Once | ORAL | Status: AC
  Administered 2018-11-09: 14:00:00 40 meq
  Filled 2018-11-09: qty 2

## 2018-11-09 MED ORDER — PANTOPRAZOLE SODIUM 40 MG IV SOLR
40.0000 mg | Freq: Two times a day (BID) | INTRAVENOUS | Status: DC
  Administered 2018-11-09 – 2018-11-10 (×4): 40 mg via INTRAVENOUS
  Filled 2018-11-09 (×4): qty 40

## 2018-11-09 MED ORDER — VITAL 1.5 CAL PO LIQD
1000.0000 mL | ORAL | Status: DC
  Administered 2018-11-09: 20:00:00 1000 mL

## 2018-11-09 MED ORDER — POTASSIUM PHOSPHATES 15 MMOLE/5ML IV SOLN
20.0000 mmol | Freq: Once | INTRAVENOUS | Status: AC
  Administered 2018-11-09: 20 mmol via INTRAVENOUS
  Filled 2018-11-09: qty 6.67

## 2018-11-09 MED ORDER — CALCIUM GLUCONATE-NACL 1-0.675 GM/50ML-% IV SOLN
1.0000 g | Freq: Once | INTRAVENOUS | Status: AC
  Administered 2018-11-09: 18:00:00 1000 mg via INTRAVENOUS
  Filled 2018-11-09: qty 50

## 2018-11-09 MED ORDER — POTASSIUM CHLORIDE 20 MEQ PO PACK: 40.0000 meq | PACK | ORAL | Status: DC

## 2018-11-09 MED ORDER — METHYLPREDNISOLONE SODIUM SUCC 40 MG IJ SOLR
40.0000 mg | Freq: Two times a day (BID) | INTRAMUSCULAR | Status: DC
  Administered 2018-11-09 – 2018-11-12 (×7): 40 mg via INTRAVENOUS
  Filled 2018-11-09 (×8): qty 1

## 2018-11-09 MED ORDER — ALBUMIN HUMAN 25 % IV SOLN
12.5000 g | Freq: Once | INTRAVENOUS | Status: AC
  Administered 2018-11-09: 15:00:00 12.5 g via INTRAVENOUS
  Filled 2018-11-09: qty 50

## 2018-11-09 MED ORDER — FUROSEMIDE 10 MG/ML IJ SOLN
40.0000 mg | Freq: Once | INTRAMUSCULAR | Status: AC
  Administered 2018-11-09: 07:00:00 40 mg via INTRAVENOUS
  Filled 2018-11-09: qty 4

## 2018-11-09 MED ORDER — MAGNESIUM SULFATE 2 GM/50ML IV SOLN
2.0000 g | Freq: Once | INTRAVENOUS | Status: AC
  Administered 2018-11-09: 13:00:00 2 g via INTRAVENOUS
  Filled 2018-11-09: qty 50

## 2018-11-09 MED ORDER — MIDAZOLAM HCL 2 MG/2ML IJ SOLN
2.0000 mg | Freq: Once | INTRAMUSCULAR | Status: AC
  Administered 2018-11-09: 2 mg via INTRAVENOUS

## 2018-11-09 MED ORDER — DIPHENHYDRAMINE HCL 50 MG/ML IJ SOLN: 50.0000 mg | Freq: Once | INTRAMUSCULAR | Status: AC

## 2018-11-09 MED ORDER — MIDAZOLAM HCL 2 MG/2ML IJ SOLN
INTRAMUSCULAR | Status: AC
  Filled 2018-11-09: qty 2

## 2018-11-09 MED ORDER — METHYLPREDNISOLONE SODIUM SUCC 125 MG IJ SOLR
60.0000 mg | Freq: Two times a day (BID) | INTRAMUSCULAR | Status: DC
  Administered 2018-11-09: 07:00:00 60 mg via INTRAVENOUS
  Filled 2018-11-09: qty 2

## 2018-11-09 MED ORDER — VITAL HIGH PROTEIN PO LIQD: 1000.0000 mL | ORAL | Status: DC

## 2018-11-09 MED ORDER — ADULT MULTIVITAMIN LIQUID CH
15.0000 mL | Freq: Every day | ORAL | Status: DC
  Administered 2018-11-09 – 2018-11-11 (×3): 15 mL
  Filled 2018-11-09 (×5): qty 15

## 2018-11-09 MED ORDER — DIPHENHYDRAMINE HCL 50 MG/ML IJ SOLN
INTRAMUSCULAR | Status: AC
  Administered 2018-11-09: 50 mg
  Filled 2018-11-09: qty 1

## 2018-11-09 MED ORDER — SODIUM CHLORIDE 0.9 % IV SOLN
3.0000 g | Freq: Four times a day (QID) | INTRAVENOUS | Status: DC
  Administered 2018-11-09 – 2018-11-17 (×32): 3 g via INTRAVENOUS
  Filled 2018-11-09 (×41): qty 3

## 2018-11-09 NOTE — Progress Notes (Signed)
[facility] - [place] at [facility]   PATIENT NAME: Angela Berry    MR#:  [id number]  DATE OF BIRTH:  28-Jan-1951  SUBJECTIVE:  CHIEF COMPLAINT:   Chief Complaint  Patient presents with  . Unresponsive   Patient seen earlier in the day.  Continues to be on full ventilatory support.  Pressor support.  REVIEW OF SYSTEMS:    Review of Systems  Unable to perform ROS: Intubated    DRUG ALLERGIES:  No Known Allergies  VITALS:  Blood pressure (!) 88/65, pulse 88, temperature 99.3 F (37.4 C), temperature source Bladder, resp. rate 18, height 5' 2.52" (1.588 m), weight 72.4 kg, SpO2 95 %.  PHYSICAL EXAMINATION:   Physical Exam  GENERAL:  68 y.o.-year-old patient lying in the bed ET tube in place.  Looks critically ill EYES: Pupils equal, round, reactive to light and accommodation. No scleral icterus. Extraocular muscles intact.  HEENT: Head atraumatic, normocephalic. Oropharynx and nasopharynx clear.  NECK:  Supple, no jugular venous distention. No thyroid enlargement, no tenderness.  LUNGS: Normal breath sounds bilaterally CARDIOVASCULAR: S1, S2 normal. No murmurs, rubs, or gallops.  ABDOMEN: Soft, nontender, nondistended. Bowel sounds present. No organomegaly or mass.  EXTREMITIES: No cyanosis, clubbing or edema b/l.     PSYCHIATRIC: The patient is sedated   LABORATORY PANEL:   CBC Recent Labs  Lab 11/09/18 0506  WBC 15.9*  HGB 12.8  HCT 39.9  PLT 233   ------------------------------------------------------------------------------------------------------------------ Chemistries  Recent Labs  Lab 11/08/18 1250  11/09/18 0646  NA 130*   < > 135  K 6.4*   < > 3.1*  CL 92*   < > 94*  CO2 24   < > 32  GLUCOSE 121*   < > 228*  BUN 14   < > 17  CREATININE 1.39*   < > 1.21*  CALCIUM 7.5*   < > 6.6*  MG  --   --  1.7  AST 1,305*  --   --   ALT 966*  --   --   ALKPHOS 72  --   --   BILITOT 1.3*  --   --    < > = values in this interval not  displayed.   ------------------------------------------------------------------------------------------------------------------  Cardiac Enzymes Recent Labs  Lab 11/09/18 0646  TROPONINI 0.04*   ------------------------------------------------------------------------------------------------------------------  RADIOLOGY:  Ct Head Wo Contrast  Result Date: 11/08/2018 CLINICAL DATA:  Found unresponsive. EXAM: CT HEAD WITHOUT CONTRAST TECHNIQUE: Contiguous axial images were obtained from the base of the skull through the vertex without intravenous contrast. COMPARISON:  None. FINDINGS: Brain: Limited motion degraded scan. No evidence of parenchymal hemorrhage or extra-axial fluid collection. No mass lesion, mass effect, or midline shift. No CT evidence of acute infarction. Cerebral volume is age appropriate. No ventriculomegaly. Vascular: No acute abnormality. Skull: No evidence of calvarial fracture. Sinuses/Orbits: Fluid level in the right maxillary sinus. Mucoperiosteal thickening in the ethmoidal air cells and maxillary sinuses. Other:  The mastoid air cells are unopacified. IMPRESSION: 1. Limited motion degraded scan. 2. No evidence of acute intracranial abnormality. No evidence of calvarial fracture. 3. Mucoperiosteal thickening in paranasal sinuses with fluid level in the right maxillary sinus, cannot exclude acute sinusitis. Electronically Signed   By: Delbert PhenixJason A Poff M.D.   On: 11/08/2018 12:25   Portable Chest Xray  Result Date: 11/09/2018 CLINICAL DATA:  Respiratory failure EXAM: PORTABLE CHEST 1 VIEW COMPARISON:  11/08/2018 FINDINGS: Cardiac shadow is stable. Endotracheal tube and gastric catheter are again seen  and stable. Lungs are well aerated bilaterally. Very mild interstitial changes are seen which are improved when compared with the prior exam. No new focal infiltrate or effusion is seen. No bony abnormality is noted. IMPRESSION: Slight improvement in the degree of interstitial edema.  Electronically Signed   By: Alcide Clever M.D.   On: 11/09/2018 07:11   Dg Chest Portable 1 View  Result Date: 11/08/2018 CLINICAL DATA:  Respiratory difficulty EXAM: PORTABLE CHEST 1 VIEW COMPARISON:  1013 hours today FINDINGS: Endotracheal tube placed. Tip is 4.7 cm from the carina. NG tube placed with its tip beyond the gastric fundus. Stable vascular congestion. Basilar interstitial edema has improved. No pneumothorax. IMPRESSION: Endotracheal and NG tubes placed in appropriate position. Improving interstitial edema. Electronically Signed   By: Jolaine Click M.D.   On: 11/08/2018 13:20   Dg Chest Portable 1 View  Result Date: 11/08/2018 CLINICAL DATA:  Unresponsive EXAM: PORTABLE CHEST 1 VIEW COMPARISON:  08/30/2012 FINDINGS: The cardiac silhouette is accentuated by AP technique. It is likely normal in size. Vascular congestion. Mild interstitial edema is suspected with prominent interstitial markings towards the lung bases. No pneumothorax or pleural effusion. IMPRESSION: Vascular congestion and basilar interstitial edema. Electronically Signed   By: Jolaine Click M.D.   On: 11/08/2018 10:41   Dg Abd Portable 1 View  Result Date: 11/08/2018 CLINICAL DATA:  Evaluate OG tube placement EXAM: PORTABLE ABDOMEN - 1 VIEW COMPARISON:  Chest x-ray Nov 08, 2018 FINDINGS: The OG tube terminates between 2 and 3 cm below the GE junction. The distal tip is in the body of the stomach. IMPRESSION: The OG tube is in good position. Electronically Signed   By: Gerome Sam III M.D   On: 11/08/2018 13:22     ASSESSMENT AND PLAN:   Patient is a 68 year old female with history of asthma and COPD being admitted to the ICU for further management of acute hypercapnic respiratory failure  *  Acute hypercapnic respiratory failure Patient with known history of COPD and asthma. Patient had to be intubated in the emergency room due to evidence of hypercapnic respiratory failure . Continue full ventilatory  support.  *  Hyperkalemia  resolved  *  Septic shock Source of sepsis not yet found. Patient was hypotensive and hypothermic.  Requiring Levophed drip at this time and warming blanket. Placed on broad-spectrum IV antibiotics with vancomycin and cefepime. Blood cultures with coag negative staph which is likely contaminant   *  Acute metabolic encephalopathy CT scan of the head normal.  Reassess once extubated.  *  Acute kidney injury Being hydrated with IV fluids.  Improving.  * Shock liver Follow AST, ALT and INR  *  Coagulopathy.  INR will 5.8. Due to shock liver  All the records are reviewed and case discussed with Care Management/Social Worker Management plans discussed with the patient, family and they are in agreement.  CODE STATUS: Full code  DVT Prophylaxis: SCDs  TOTAL TIME TAKING CARE OF THIS PATIENT: 35 minutes.   POSSIBLE D/C IN 4-5 DAYS, DEPENDING ON CLINICAL CONDITION.  Molinda Bailiff Psalms Olarte M.D on 11/09/2018 at 1:57 PM  Between 7am to 6pm - Pager - [phone]  After 6pm go to [email or web address] - password [email or web address]  [facility]  Office  [phone]  CC: Primary care physician; Martie Round, NP  Note: This dictation was prepared with Dragon dictation along with smaller phrase technology. Any transcriptional errors that result from this process are unintentional.

## 2018-11-09 NOTE — Progress Notes (Signed)
Rectal temp. 94.3. Bair hugger placed on pt. 

## 2018-11-09 NOTE — Progress Notes (Signed)
Pharmacy Antibiotic Note  Angela Berry is a 68 y.o. female admitted on 11/08/2018. Patient admitted via EMS after being intoxicated and passing out. Patient intubated in ED for acute hypercapnic respiratory failure on 5/31. Patient initially started on vancomycin and cefepime. MRSA PCR is negative. Pharmacy has been consulted for Unasyn dosing.  Plan: Per AM ICU rounds, will discontinue cefepime and vancomycin. Will initiate Unasyn 3g IV Q6hr to cover possible CAP and aspiration event.   Height: 5' 2.52" (158.8 cm) Weight: 159 lb 9.8 oz (72.4 kg) IBW/kg (Calculated) : 51.3  Temp (24hrs), Avg:96.8 F (36 C), Min:94.3 F (34.6 C), Max:100.3 F (37.9 C)  Recent Labs  Lab 11/08/18 1028 11/08/18 1138 11/08/18 1250 11/08/18 1355 11/08/18 1510 11/09/18 0506 11/09/18 0646  WBC 15.2*  --   --   --   --  15.9*  --   CREATININE  --   --  1.39*  --  1.36*  --  1.21*  LATICACIDVEN  --  2.4*  --  2.1*  --   --   --     Estimated Creatinine Clearance: 43.7 mL/min (A) (by C-G formula based on SCr of 1.21 mg/dL (H)).    No Known Allergies  Antimicrobials this admission: Cefepime 5/31 >> 6/1  Vancomycin 5/31 >> 5/31  Unasyn 6/1 >>   Dose adjustments this admission: N/A  Microbiology results: 5/31 BCx: MSSS 2/4 5/31 MRSA PCR: negative  5/31 COVID: negative   Thank you for allowing pharmacy to be a part of this patient's care.  Simpson,Michael L 11/09/2018 12:10 PM

## 2018-11-09 NOTE — Progress Notes (Signed)
PHARMACY - PHYSICIAN COMMUNICATION CRITICAL VALUE ALERT - BLOOD CULTURE IDENTIFICATION (BCID)  Angela Berry is an 68 y.o. female who presented to [facility] on 11/08/2018 with a chief complaint of unresponsiveness   Assessment:  Tmin 93.8 - 94.6, tachycardic, LA 2.1, pH 7.14 >> 7.41, PCT 0.51, CXR vascular congestion and interstitial edema, 1/4 GPC BCID Staph species MecA -   Name of physician (or Provider) Contacted: Maude Leriche  Current antibiotics: Vanc/cefepime  Changes to prescribed antibiotics recommended:  Patient is on recommended antibiotics - No changes needed  Results for orders placed or performed during the hospital encounter of 11/08/18  Blood Culture ID Panel (Reflexed) (Collected: 11/08/2018 11:38 AM)  Result Value Ref Range   Enterococcus species NOT DETECTED NOT DETECTED   Listeria monocytogenes NOT DETECTED NOT DETECTED   Staphylococcus species DETECTED (A) NOT DETECTED   Staphylococcus aureus (BCID) NOT DETECTED NOT DETECTED   Methicillin resistance NOT DETECTED NOT DETECTED   Streptococcus species NOT DETECTED NOT DETECTED   Streptococcus agalactiae NOT DETECTED NOT DETECTED   Streptococcus pneumoniae NOT DETECTED NOT DETECTED   Streptococcus pyogenes NOT DETECTED NOT DETECTED   Acinetobacter baumannii NOT DETECTED NOT DETECTED   Enterobacteriaceae species NOT DETECTED NOT DETECTED   Enterobacter cloacae complex NOT DETECTED NOT DETECTED   Escherichia coli NOT DETECTED NOT DETECTED   Klebsiella oxytoca NOT DETECTED NOT DETECTED   Klebsiella pneumoniae NOT DETECTED NOT DETECTED   Proteus species NOT DETECTED NOT DETECTED   Serratia marcescens NOT DETECTED NOT DETECTED   Haemophilus influenzae NOT DETECTED NOT DETECTED   Neisseria meningitidis NOT DETECTED NOT DETECTED   Pseudomonas aeruginosa NOT DETECTED NOT DETECTED   Candida albicans NOT DETECTED NOT DETECTED   Candida glabrata NOT DETECTED NOT DETECTED   Candida krusei NOT DETECTED NOT DETECTED   Candida parapsilosis NOT DETECTED NOT DETECTED   Candida tropicalis NOT DETECTED NOT DETECTED   Thomasene Ripple, PharmD, BCPS Clinical Pharmacist 11/09/2018  4:59 AM

## 2018-11-09 NOTE — Progress Notes (Signed)
*  PRELIMINARY RESULTS* Echocardiogram 2D Echocardiogram has been performed.  Joanette Gula Orbin Mayeux 11/09/2018, 8:51 AM

## 2018-11-09 NOTE — Consult Note (Signed)
CRITICAL CARE NOTE      CHIEF COMPLAINT:   Lethargic after EtOH and narcotic use.   HPI   This is 68 year old female with a history of severe COPD followed by at [facility] pulmonary who is actively smoking, she was riding to the ED shortness of breath and syncope after drinking currently using narcotics.  EMS found her unresponsive and had started Narcan in the field and patient had become severely agitated.  ER staff was unable to comfort patient and subsequently noticed worsening shortness of breath with chest x-ray consistent with flash pulmonary edema requiring endotracheal intubation.  Hospitalist service had evaluated patient in the ED with initial medical management boarded in ER due to no bed availability in ICU.  Critical care consultation was placed for upgrade to MICU and management of hypoxemic respiratory failure.  She is currently on MV unable to wean lower then 50%FiO2, requiring Levophed support with MAP67, diaphoretic, possible aspiration PNA.   -Attempted to wean down FiO2,  was unable to and patient became severely agitated with signs of drug withdrawal including severe tachycardia, diaphoresis and erratic behavior.   PAST MEDICAL HISTORY   Past Medical History:  Diagnosis Date  . Asthma   . COPD (chronic obstructive pulmonary disease) ([place])   . Dyspnea      SURGICAL HISTORY   Past Surgical History:  Procedure Laterality Date  . CRANIOPLASTY  1976  . EXPLORATORY LAPAROTOMY    . HYSTERECTOMY ABDOMINAL WITH SALPINGECTOMY       FAMILY HISTORY   History reviewed. No pertinent family history.   SOCIAL HISTORY   Social History   Tobacco Use  . Smoking status: Current Every Day Smoker    Packs/day: 1.00    Years: 40.00    Pack years: 40.00    Types: Cigarettes  . Smokeless tobacco:  Never Used  Substance Use Topics  . Alcohol use: Yes    Comment: occasional  . Drug use: Not on file     MEDICATIONS   Current Medication:  Current Facility-Administered Medications:  .  Ampicillin-Sulbactam (UNASYN) 3 g in sodium chloride 0.9 % 100 mL IVPB, 3 g, Intravenous, Q6H, Charlett Nose, RPH .  calcium gluconate 1 g/ 50 mL sodium chloride IVPB, 1 g, Intravenous, Once, Dallie Piles, RPH .  chlorhexidine gluconate (MEDLINE KIT) (PERIDEX) 0.12 % solution 15 mL, 15 mL, Mouth Rinse, BID, Tukov-Yual, Magdalene S, NP, 15 mL at 11/09/18 0733 .  fentaNYL 2570mg in NS 2536m(1035mml) infusion-PREMIX,  0-400 mcg/hr, Intravenous, Continuous, Tukov-Yual, Magdalene S, NP, Last Rate: 12.5 mL/hr at 11/09/18 1200, 125 mcg/hr at 11/09/18 1200 .  ipratropium-albuterol (DUONEB) 0.5-2.5 (3) MG/3ML nebulizer solution 3 mL, 3 mL, Nebulization, Q6H, Tukov-Yual, Magdalene S, NP, 3 mL at 11/09/18 0815 .  magnesium sulfate IVPB 2 g 50 mL, 2 g, Intravenous, Once, Charlett Nose, RPH, Last Rate: 50 mL/hr at 11/09/18 1247, 2 g at 11/09/18 1247 .  MEDLINE mouth rinse, 15 mL, Mouth Rinse, 10 times per day, Tukov-Yual, Magdalene S, NP, 15 mL at 11/09/18 1241 .  methylPREDNISolone sodium succinate (SOLU-MEDROL) 125 mg/2 mL injection 60 mg, 60 mg, Intravenous, Q12H, Tukov-Yual, Magdalene S, NP, 60 mg at 11/09/18 0657 .  norepinephrine (LEVOPHED) '4mg'$  in 240m premix infusion, 0-40 mcg/min, Intravenous, Continuous, Ojie, Jude, MD, Last Rate: 30 mL/hr at 11/09/18 0715, 8 mcg/min at 11/09/18 0715 .  pantoprazole (PROTONIX) injection 40 mg, 40 mg, Intravenous, BID, ALanney Gins Renai Lopata, MD, 40 mg at 11/09/18 1114 .  potassium chloride (KLOR-CON) packet 40 mEq, 40 mEq, Per Tube, Once, SCharlett Nose RPH .  potassium PHOSPHATE 20 mmol in dextrose 5 % 500 mL infusion, 20 mmol, Intravenous, Once, SCharlett Nose RPH, Last Rate: 84 mL/hr at 11/09/18 1128, 20 mmol at 11/09/18 1128 .  propofol (DIPRIVAN) 1000  MG/100ML infusion, 5-80 mcg/kg/min, Intravenous, Continuous, Pyreddy, Pavan, MD, Last Rate: 3.84 mL/hr at 11/09/18 0651, 8 mcg/kg/min at 11/09/18 0651    ALLERGIES   Patient has no known allergies.    REVIEW OF SYSTEMS     Unable to obtain due to mechanical ventilation and sedation  PHYSICAL EXAMINATION   Vitals:   11/09/18 1200 11/09/18 1233  BP: (!) 88/65   Pulse: 88   Resp: 18   Temp: 99.3 F (37.4 C)   SpO2: 95% 94%    GENERAL: Sedated on mechanical ventilation HEAD: Normocephalic, atraumatic.  EYES: Pupils equal, round, reactive to light.  No scleral icterus.  MOUTH: Moist mucosal membrane. NECK: Supple. No thyromegaly. No nodules. No JVD.  PULMONARY: Bibasilar crackles CARDIOVASCULAR: S1 and S2. Regular rate and rhythm. No murmurs, rubs, or gallops.  GASTROINTESTINAL: Soft, nontender, non-distended. No masses. Positive bowel sounds. No hepatosplenomegaly.  MUSCULOSKELETAL: No swelling, clubbing, or edema.  NEUROLOGIC: Mild distress due to acute illness SKIN:intact,warm,dry   LABS AND IMAGING     LAB RESULTS: Recent Labs  Lab 11/08/18 1250 11/08/18 1510 11/09/18 0646  NA 130* 131* 135  K 6.4* 5.6* 3.1*  CL 92* 93* 94*  CO2 24 24 32  BUN '14 16 17  '$ CREATININE 1.39* 1.36* 1.21*  GLUCOSE 121* 153* 228*   Recent Labs  Lab 11/08/18 1028 11/09/18 0506  HGB 15.7* 12.8  HCT 49.6* 39.9  WBC 15.2* 15.9*  PLT 287 233     IMAGING RESULTS: Portable Chest Xray  Result Date: 11/09/2018 CLINICAL DATA:  Respiratory failure EXAM: PORTABLE CHEST 1 VIEW COMPARISON:  11/08/2018 FINDINGS: Cardiac shadow is stable. Endotracheal tube and gastric catheter are again seen and stable. Lungs are well aerated bilaterally. Very mild interstitial changes are seen which are improved when compared with the prior exam. No new focal infiltrate or effusion is seen. No bony abnormality is noted. IMPRESSION: Slight improvement in the degree of interstitial edema. Electronically  Signed   By: MInez CatalinaM.D.   On: 11/09/2018 07:11      ASSESSMENT AND PLAN    -Multidisciplinary rounds held today  Acute Hypoxic Respiratory Failure -Likely due to narcotic overdose with EtOH abuse -Initial  improvement with Narcan followed by flash pulmonary edema -diaphoretic , MAP67 with levophed on 52mg/hr Levophed, FiO2 50% satting 89-91% - likely due to aspiration pneumonia   Chronic kidney disease -Possibly due to FSG secondary to chronic narcotic abuse -follow chem 7 -follow UO -continue Foley Catheter-assess need daily   NEUROLOGY - intubated and sedated - minimal sedation to achieve a RASS goal: -1 Wake up assessment pending   GI/Nutrition GI PROPHYLAXIS as indicated DIET-->TF's as tolerated Constipation protocol as indicated  ENDO - ICU hypoglycemic\Hyperglycemia protocol -check FSBS per protocol   ELECTROLYTES -follow labs as needed -replace as needed -pharmacy consultation   DVT/GI PRX ordered -SCDs  TRANSFUSIONS AS NEEDED MONITOR FSBS ASSESS the need for LABS as needed   Critical care provider statement:    Critical care time (minutes):  32   Critical care time was exclusive of:  Separately billable procedures and treating other patients   Critical care was necessary to treat or prevent imminent or life-threatening deterioration of the following conditions:   Acute hypoxemic respiratory failure, narcotic abuse, severe COPD, hypokalemia, multiple comorbid conditions   Critical care was time spent personally by me on the following activities:  Development of treatment plan with patient or surrogate, discussions with consultants, evaluation of patient's response to treatment, examination of patient, obtaining history from patient or surrogate, ordering and performing treatments and interventions, ordering and review of laboratory studies and re-evaluation of patient's condition.  I assumed direction of critical care for this patient from another  provider in my specialty: no    This document was prepared using Dragon voice recognition software and may include unintentional dictation errors.    FOttie Glazier M.D.  Division of [facility]

## 2018-11-09 NOTE — Consult Note (Signed)
PHARMACY CONSULT NOTE - FOLLOW UP  Pharmacy Consult for Electrolyte Monitoring and Replacement   Recent Labs: Potassium (mmol/L)  Date Value  11/09/2018 3.1 (L)  08/30/2012 3.8   Magnesium (mg/dL)  Date Value  34/35/6861 1.7   Calcium (mg/dL)  Date Value  68/37/2902 6.6 (L)   Calcium, Total (mg/dL)  Date Value  04/25/5207 8.9   Albumin (g/dL)  Date Value  08/02/3610 3.2 (L)  08/30/2012 3.4   Phosphorus (mg/dL)  Date Value  24/49/7530 1.9 (L)   Sodium (mmol/L)  Date Value  11/09/2018 135  08/30/2012 132 (L)   Corrected Ca: 7.24 mg/dL  Assessment: 68 y.o. female with a history of COPD and asthma who was brought into the ED by EMS with report of passing out after drinking some wine the night before. She has noted electrolyte abnormalities  Goal of Therapy:  K ~ 4, magnesium ~2, phosphorous 2.5-4.6, calcium 8-10  Plan:  --20 mmol IV potassium phosphate: equaling approximately 29 mEq potassium --40 mEq po KCl q4h  --calcium gluconate 1 gram IV --F/U labs in the am and replace as needed  Lowella Bandy ,PharmD Clinical Pharmacist 11/09/2018 12:27 PM

## 2018-11-09 NOTE — Progress Notes (Signed)
Pt desat to 84%. Minimal secretions out with ETT inline suctioning, small, thick and white. Vent increased to 100% FiO2.  M. Luci Bank, NP notified. RT called for med neb tx.  Currently Sao2=99%

## 2018-11-09 NOTE — Progress Notes (Signed)
Initial Nutrition Assessment  DOCUMENTATION CODES:   Not applicable  INTERVENTION:  Initiate Vital 1.5 Cal at 20 mL/hr.  If patient tolerating tomorrow AM, recommend advancing to goal regimen of Vital 1.5 Cal at 40 mL/hr (960 mL goal daily volume) + Pro-Stat 30 mL BID per tube. Provides 1640 kcal, 95 grams of protein, 730 mL H2O daily.  Provide minimum free water flush of 30 mL Q4hrs to maintain tube patency.  Provide liquid MVI daily per tube.  NUTRITION DIAGNOSIS:   Inadequate oral intake related to inability to eat as evidenced by NPO status.  GOAL:   Provide needs based on ASPEN/SCCM guidelines  MONITOR:   Vent status, Labs, Weight trends, TF tolerance, I & O's  REASON FOR ASSESSMENT:   Ventilator    ASSESSMENT:   68 year old female with PMHx of COPD, asthma admitted with acute hypercapnic respiratory failure requiring intubation on 5/31, hyperkalemia, septic shock, acute metabolic encephalopathy, AKI.   Patient intubated and sedated. On PRVC mode with FiO2 50% and PEEP 5 cmH2O. Abdomen distended but soft per RN documentation. Last BM unknown/PTA. No weight history in chart to trend.  Enteral Access: 18 Fr. OGT placed 5/31; terminates in stomach per abdominal x-ray 5/31; 53 cm at corner of mouth  MAP: 69-93 mmHg  Patient is currently intubated on ventilator support Ve: 9 L/min Temp (24hrs), Avg:97.5 F (36.4 C), Min:94.3 F (34.6 C), Max:100.3 F (37.9 C)  Propofol: 1 mL/hr at time of RD assessment (26 kcal daily)  Medications reviewed and include: Solu-Medrol 40 mg Q12hrs IV, pantoprazole, Unasyn, calcium gluconate 1 gram IV once today, fentanyl gtt, norepinephrine gtt at 8 mcg/min, potassium phosphate 20 mmol IV once today, propofol gtt.  Labs reviewed: Potassium 3.1, Chloride 94, Creatinine 1.21, Phosphorus 1.9.  I/O: 675 mL UOP yesterday  Patient does not meet criteria for malnutrition at this time.  Discussed with RN and on rounds. Plan is to start  trickle tube feeds today.  NUTRITION - FOCUSED PHYSICAL EXAM:    Most Recent Value  Orbital Region  No depletion  Upper Arm Region  No depletion  Thoracic and Lumbar Region  No depletion  Buccal Region  Unable to assess  Temple Region  No depletion  Clavicle Bone Region  No depletion  Clavicle and Acromion Bone Region  No depletion  Scapular Bone Region  Unable to assess  Dorsal Hand  No depletion  Patellar Region  No depletion  Anterior Thigh Region  No depletion  Posterior Calf Region  No depletion  Edema (RD Assessment)  None  Hair  Reviewed  Eyes  Unable to assess  Mouth  Unable to assess  Skin  Reviewed  Nails  Reviewed     Diet Order:   Diet Order            Diet NPO time specified  Diet effective now             EDUCATION NEEDS:   No education needs have been identified at this time  Skin:  Skin Assessment: Reviewed RN Assessment  Last BM:  Unknown/PTA  Height:   Ht Readings from Last 1 Encounters:  11/08/18 5' 2.52" (1.588 m)   Weight:   Wt Readings from Last 1 Encounters:  11/08/18 72.4 kg   Ideal Body Weight:  51.2 kg  BMI:  Body mass index is 28.71 kg/m.  Estimated Nutritional Needs:   Kcal:  1582 (PSU 2003b w/ MSJ 1235, Ve 9, Tmax 37.9)  Protein:  85-110  grams (1.2-1.5 grams/kg)  Fluid:  1.8-2.1 L/day  Helane Rima, MS, RD, LDN Office: [phone] Pager: [phone] After Hours/Weekend Pager: [phone]

## 2018-11-10 DIAGNOSIS — J441 Chronic obstructive pulmonary disease with (acute) exacerbation: Secondary | ICD-10-CM

## 2018-11-10 DIAGNOSIS — J9602 Acute respiratory failure with hypercapnia: Secondary | ICD-10-CM

## 2018-11-10 LAB — BLOOD GAS, ARTERIAL
Acid-Base Excess: 3.6 mmol/L — ABNORMAL HIGH (ref 0.0–2.0)
Bicarbonate: 31.4 mmol/L — ABNORMAL HIGH (ref 20.0–28.0)
FIO2: 0.5
MECHVT: 450 mL
O2 Saturation: 94.3 %
PEEP: 5 cmH2O
Patient temperature: 37
RATE: 20 resp/min
pCO2 arterial: 61 mmHg — ABNORMAL HIGH (ref 32.0–48.0)
pH, Arterial: 7.32 — ABNORMAL LOW (ref 7.350–7.450)
pO2, Arterial: 78 mmHg — ABNORMAL LOW (ref 83.0–108.0)

## 2018-11-10 LAB — CBC WITH DIFFERENTIAL/PLATELET
Abs Immature Granulocytes: 0.15 10*3/uL — ABNORMAL HIGH (ref 0.00–0.07)
Basophils Absolute: 0 10*3/uL (ref 0.0–0.1)
Basophils Relative: 0 %
Eosinophils Absolute: 0 10*3/uL (ref 0.0–0.5)
Eosinophils Relative: 0 %
HCT: 43.9 % (ref 36.0–46.0)
Hemoglobin: 14.2 g/dL (ref 12.0–15.0)
Immature Granulocytes: 1 %
Lymphocytes Relative: 3 %
Lymphs Abs: 0.6 10*3/uL — ABNORMAL LOW (ref 0.7–4.0)
MCH: 30.7 pg (ref 26.0–34.0)
MCHC: 32.3 g/dL (ref 30.0–36.0)
MCV: 94.8 fL (ref 80.0–100.0)
Monocytes Absolute: 0.8 10*3/uL (ref 0.1–1.0)
Monocytes Relative: 4 %
Neutro Abs: 16.3 10*3/uL — ABNORMAL HIGH (ref 1.7–7.7)
Neutrophils Relative %: 92 %
Platelets: 251 10*3/uL (ref 150–400)
RBC: 4.63 MIL/uL (ref 3.87–5.11)
RDW: 16.9 % — ABNORMAL HIGH (ref 11.5–15.5)
WBC: 17.8 10*3/uL — ABNORMAL HIGH (ref 4.0–10.5)
nRBC: 0 % (ref 0.0–0.2)

## 2018-11-10 LAB — GLUCOSE, CAPILLARY
Glucose-Capillary: 141 mg/dL — ABNORMAL HIGH (ref 70–99)
Glucose-Capillary: 145 mg/dL — ABNORMAL HIGH (ref 70–99)
Glucose-Capillary: 166 mg/dL — ABNORMAL HIGH (ref 70–99)
Glucose-Capillary: 136 mg/dL — ABNORMAL HIGH (ref 70–99)
Glucose-Capillary: 142 mg/dL — ABNORMAL HIGH (ref 70–99)

## 2018-11-10 LAB — COMPREHENSIVE METABOLIC PANEL
ALT: 1131 U/L — ABNORMAL HIGH (ref 0–44)
AST: 648 U/L — ABNORMAL HIGH (ref 15–41)
Albumin: 3 g/dL — ABNORMAL LOW (ref 3.5–5.0)
Alkaline Phosphatase: 71 U/L (ref 38–126)
Anion gap: 10 (ref 5–15)
BUN: 21 mg/dL (ref 8–23)
CO2: 30 mmol/L (ref 22–32)
Calcium: 7.2 mg/dL — ABNORMAL LOW (ref 8.9–10.3)
Chloride: 96 mmol/L — ABNORMAL LOW (ref 98–111)
Creatinine, Ser: 1.47 mg/dL — ABNORMAL HIGH (ref 0.44–1.00)
GFR calc Af Amer: 43 mL/min — ABNORMAL LOW (ref >60)
GFR calc non Af Amer: 37 mL/min — ABNORMAL LOW (ref >60)
Glucose, Bld: 140 mg/dL — ABNORMAL HIGH (ref 70–99)
Potassium: 4.8 mmol/L (ref 3.5–5.1)
Sodium: 136 mmol/L (ref 135–145)
Total Bilirubin: 0.7 mg/dL (ref 0.3–1.2)
Total Protein: 6 g/dL — ABNORMAL LOW (ref 6.5–8.1)

## 2018-11-10 LAB — URINE DRUG SCREEN, QUALITATIVE (ARMC ONLY)
Amphetamines, Ur Screen: NOT DETECTED
Barbiturates, Ur Screen: NOT DETECTED
Benzodiazepine, Ur Scrn: POSITIVE — AB
Cannabinoid 50 Ng, Ur ~~LOC~~: POSITIVE — AB
Cocaine Metabolite,Ur ~~LOC~~: NOT DETECTED
MDMA (Ecstasy)Ur Screen: NOT DETECTED
Methadone Scn, Ur: NOT DETECTED
Opiate, Ur Screen: NOT DETECTED
Phencyclidine (PCP) Ur S: NOT DETECTED
Tricyclic, Ur Screen: NOT DETECTED

## 2018-11-10 LAB — HIV ANTIBODY (ROUTINE TESTING W REFLEX): HIV Screen 4th Generation wRfx: NONREACTIVE

## 2018-11-10 LAB — PROCALCITONIN: Procalcitonin: 1.51 ng/mL

## 2018-11-10 LAB — LACTIC ACID, PLASMA: Lactic Acid, Venous: 1.4 mmol/L (ref 0.5–1.9)

## 2018-11-10 LAB — TRIGLYCERIDES: Triglycerides: 108 mg/dL (ref <150)

## 2018-11-10 LAB — PHOSPHORUS: Phosphorus: 4.9 mg/dL — ABNORMAL HIGH (ref 2.5–4.6)

## 2018-11-10 LAB — MAGNESIUM: Magnesium: 2.5 mg/dL — ABNORMAL HIGH (ref 1.7–2.4)

## 2018-11-10 MED ORDER — METHYLPREDNISOLONE SODIUM SUCC 40 MG IJ SOLR
40.0000 mg | INTRAMUSCULAR | Status: AC
  Administered 2018-11-10: 04:00:00 40 mg via INTRAVENOUS
  Filled 2018-11-10: qty 1

## 2018-11-10 MED ORDER — FENTANYL BOLUS VIA INFUSION
50.0000 ug | INTRAVENOUS | Status: DC | PRN
  Administered 2018-11-10 – 2018-11-11 (×3): 100 ug via INTRAVENOUS
  Filled 2018-11-10: qty 100

## 2018-11-10 MED ORDER — IPRATROPIUM-ALBUTEROL 0.5-2.5 (3) MG/3ML IN SOLN
3.0000 mL | RESPIRATORY_TRACT | Status: DC
  Administered 2018-11-10 – 2018-11-15 (×31): 3 mL via RESPIRATORY_TRACT
  Filled 2018-11-10 (×30): qty 3

## 2018-11-10 MED ORDER — BUDESONIDE 0.5 MG/2ML IN SUSP
0.5000 mg | Freq: Two times a day (BID) | RESPIRATORY_TRACT | Status: DC
  Administered 2018-11-10 – 2018-11-17 (×12): 0.5 mg via RESPIRATORY_TRACT
  Filled 2018-11-10 (×15): qty 2

## 2018-11-10 MED ORDER — FENTANYL BOLUS VIA INFUSION: 50.0000 ug | INTRAVENOUS | Status: DC | PRN

## 2018-11-10 NOTE — Progress Notes (Addendum)
CRITICAL CARE NOTE  CC  follow up respiratory failure  SUBJECTIVE Patient remains critically ill Prognosis is guarded severe COPD  Remains on vent    BP 98/60   Pulse 73   Temp 99.1 F (37.3 C) (Bladder)   Resp (!) 22   Ht 5' 2.52" (1.588 m)   Wt 75.1 kg   SpO2 91%   BMI 29.78 kg/m    I/O last 3 completed shifts: In: 9276.8 [I.V.:8940.8; NG/GT:136; IV Piggyback:200] Out: 1560 [Urine:1560] Total I/O In: 543.2 [I.V.:303.2; NG/GT:140; IV Piggyback:100] Out: 150 [Urine:150]  SpO2: 91 % O2 Flow Rate (L/min): 5 L/min FiO2 (%): 40 %    REVIEW OF SYSTEMS  PATIENT IS UNABLE TO PROVIDE COMPLETE REVIEW OF SYSTEMS DUE TO SEVERE CRITICAL ILLNESS   PHYSICAL EXAMINATION:  GENERAL:critically ill appearing, +resp distress HEAD: Normocephalic, atraumatic.  EYES: Pupils equal, round, reactive to light.  No scleral icterus.  MOUTH: Moist mucosal membrane. NECK: Supple. No thyromegaly. No nodules. No JVD.  PULMONARY: +rhonchi, +wheezing CARDIOVASCULAR: S1 and S2. Regular rate and rhythm. No murmurs, rubs, or gallops.  GASTROINTESTINAL: Soft, nontender, -distended. No masses. Positive bowel sounds. No hepatosplenomegaly.  MUSCULOSKELETAL: No swelling, clubbing, or edema.  NEUROLOGIC: obtunded, GCS<8 SKIN:intact,warm,dry  MEDICATIONS: I have reviewed all medications and confirmed regimen as documented   CULTURE RESULTS   Recent Results (from the past 240 hour(s))  Blood culture (routine x 2)     Status: None (Preliminary result)   Collection Time: 11/08/18 11:38 AM  Result Value Ref Range Status   Specimen Description BLOOD RIGHT ANTECUBITAL  Final   Special Requests   Final    BOTTLES DRAWN AEROBIC AND ANAEROBIC Blood Culture adequate volume   Culture   Final    NO GROWTH 2 DAYS Performed at [facility], [address] [place], [place] [address]    Report Status PENDING  Incomplete  Blood culture (routine x 2)     Status: None (Preliminary result)    Collection Time: 11/08/18 11:38 AM  Result Value Ref Range Status   Specimen Description   Final    BLOOD LEFT ANTECUBITAL Performed at [facility], [address], [place], [place] [address]    Special Requests   Final    BOTTLES DRAWN AEROBIC AND ANAEROBIC Blood Culture adequate volume Performed at [facility] [address] [place], [place] [address]    Culture  Setup Time   Final    IN BOTH AEROBIC AND ANAEROBIC BOTTLES GRAM POSITIVE COCCI CRITICAL RESULT CALLED TO, READ BACK BY AND VERIFIED WITH: DAVID BESANTI AT 0326 ON 11/09/2018 [place] Performed at [facility], [place] [address], [place], [place] [address]    Culture GRAM POSITIVE COCCI  Final   Report Status PENDING  Incomplete  Blood Culture ID Panel (Reflexed)     Status: Abnormal   Collection Time: 11/08/18 11:38 AM  Result Value Ref Range Status   Enterococcus species NOT DETECTED NOT DETECTED Final   Listeria monocytogenes NOT DETECTED NOT DETECTED Final   Staphylococcus species DETECTED (A) NOT DETECTED Final    Comment: Methicillin (oxacillin) susceptible coagulase negative staphylococcus. Possible blood culture contaminant (unless isolated from more than one blood culture draw or clinical case suggests pathogenicity). No antibiotic treatment is indicated for blood  culture contaminants. CRITICAL RESULT CALLED TO, READ BACK BY AND VERIFIED WITH: DAVID BESANTI AT 0326 ON 11/09/2018 JJ[place]    Staphylococcus aureus (BCID) NOT DETECTED NOT DETECTED Final   Methicillin resistance NOT DETECTED  NOT DETECTED Final   Streptococcus species NOT DETECTED NOT DETECTED Final   Streptococcus agalactiae NOT DETECTED NOT DETECTED Final   Streptococcus pneumoniae NOT DETECTED NOT DETECTED Final   Streptococcus pyogenes NOT DETECTED NOT DETECTED Final   Acinetobacter baumannii NOT DETECTED NOT DETECTED Final   Enterobacteriaceae species NOT DETECTED NOT DETECTED Final   Enterobacter cloacae complex NOT DETECTED  NOT DETECTED Final   Escherichia coli NOT DETECTED NOT DETECTED Final   Klebsiella oxytoca NOT DETECTED NOT DETECTED Final   Klebsiella pneumoniae NOT DETECTED NOT DETECTED Final   Proteus species NOT DETECTED NOT DETECTED Final   Serratia marcescens NOT DETECTED NOT DETECTED Final   Haemophilus influenzae NOT DETECTED NOT DETECTED Final   Neisseria meningitidis NOT DETECTED NOT DETECTED Final   Pseudomonas aeruginosa NOT DETECTED NOT DETECTED Final   Candida albicans NOT DETECTED NOT DETECTED Final   Candida glabrata NOT DETECTED NOT DETECTED Final   Candida krusei NOT DETECTED NOT DETECTED Final   Candida parapsilosis NOT DETECTED NOT DETECTED Final   Candida tropicalis NOT DETECTED NOT DETECTED Final    Comment: Performed at [facility], [address] [place], [place] [address]  SARS Coronavirus 2 (CEPHEID - Performed in [place] hospital lab), Hosp Order     Status: None   Collection Time: 11/08/18 12:22 PM  Result Value Ref Range Status   SARS Coronavirus 2 NEGATIVE NEGATIVE Final    Comment: (NOTE) If result is NEGATIVE SARS-CoV-2 target nucleic acids are NOT DETECTED. The SARS-CoV-2 RNA is generally detectable in upper and lower  respiratory specimens during the acute phase of infection. The lowest  concentration of SARS-CoV-2 viral copies this assay can detect is 250  copies / mL. A negative result does not preclude SARS-CoV-2 infection  and should not be used as the sole basis for treatment or other  patient management decisions.  A negative result may occur with  improper specimen collection / handling, submission of specimen other  than nasopharyngeal swab, presence of viral mutation(s) within the  areas targeted by this assay, and inadequate number of viral copies  (<250 copies / mL). A negative result must be combined with clinical  observations, patient history, and epidemiological information. If result is POSITIVE SARS-CoV-2 target nucleic acids are  DETECTED. The SARS-CoV-2 RNA is generally detectable in upper and lower  respiratory specimens dur ing the acute phase of infection.  Positive  results are indicative of active infection with SARS-CoV-2.  Clinical  correlation with patient history and other diagnostic information is  necessary to determine patient infection status.  Positive results do  not rule out bacterial infection or co-infection with other viruses. If result is PRESUMPTIVE POSTIVE SARS-CoV-2 nucleic acids MAY BE PRESENT.   A presumptive positive result was obtained on the submitted specimen  and confirmed on repeat testing.  While 2019 novel coronavirus  (SARS-CoV-2) nucleic acids may be present in the submitted sample  additional confirmatory testing may be necessary for epidemiological  and / or clinical management purposes  to differentiate between  SARS-CoV-2 and other Sarbecovirus currently known to infect humans.  If clinically indicated additional testing with an alternate test  methodology [phone]) is advised. The SARS-CoV-2 RNA is generally  detectable in upper and lower respiratory sp ecimens during the acute  phase of infection. The expected result is Negative. Fact Sheet for Patients:  StrictlyIdeas.no Fact Sheet for Healthcare Providers: BankingDealers.co.za This test is not yet approved or cleared by the [place] FDA and has been  authorized for detection and/or diagnosis of SARS-CoV-2 by FDA under an Emergency Use Authorization (EUA).  This EUA will remain in effect (meaning this test can be used) for the duration of the COVID-19 declaration under Section 564(b)(1) of the Act, 21 U.S.C. section 360bbb-3(b)(1), unless the authorization is terminated or revoked sooner. Performed at [facility], [address] [place], [place] [address]   MRSA PCR Screening     Status: None   Collection Time: 11/08/18  7:39 PM  Result Value Ref Range  Status   MRSA by PCR NEGATIVE NEGATIVE Final    Comment:        The GeneXpert MRSA Assay (FDA approved for NASAL specimens only), is one component of a comprehensive MRSA colonization surveillance program. It is not intended to diagnose MRSA infection nor to guide or monitor treatment for MRSA infections. Performed at [facility], [address] [place], [place] [address]           IMAGING    Dg Abd 1 View  Result Date: 11/09/2018 CLINICAL DATA:  Nasogastric tube placement EXAM: ABDOMEN - 1 VIEW COMPARISON:  11/08/2018 FINDINGS: The enteric tube projects over the gastric body. The tip is pointed distally. The side-hole projects over the GE junction. IMPRESSION: Stable positioning of the enteric tube with tip projecting over the gastric body. Electronically Signed   By: Constance Holster M.D.   On: 11/09/2018 19:51       Indwelling Urinary Catheter continued, requirement due to   Reason to continue Indwelling Urinary Catheter strict Intake/Output monitoring for hemodynamic instability   Central Line/ continued, requirement due to  Reason to continue [place] of central venous pressure or other hemodynamic parameters and poor IV access   Ventilator continued, requirement due to severe respiratory failure   Ventilator Sedation RASS 0 to -2      ASSESSMENT AND PLAN SYNOPSIS  Admitted for acute severe hypoxic resp failure from ETOH abuse and Narcotic OD with acute and severe COPD exacerbation aspiration pneumonia associated with cannibus use   Severe ACUTE Hypoxic and Hypercapnic Respiratory Failure -continue Full MV support -continue Bronchodilator Therapy -Wean Fio2 and PEEP as tolerated -will perform SAT/SBT when respiratory parameters are met  SEVERE COPD EXACERBATION -continue IV steroids as prescribed -continue NEB THERAPY as prescribed -morphine as needed -wean fio2 as needed and tolerated   ACUTE KIDNEY INJURY/Renal  Failure -follow chem 7 -follow UO -continue Foley Catheter-assess need -Avoid nephrotoxic agents -Recheck creatinine    NEUROLOGY - intubated and sedated - minimal sedation to achieve a RASS goal: -1 Wake up assessment pending   SHOCK-SEPSIS/HYPOVOLUMIC -use vasopressors to keep MAP>65 if needed -follow ABG and LA -follow up cultures -emperic ABX -aggressive IV fluid resuscitation  CARDIAC ICU monitoring  ID -continue IV abx as prescibed -follow up cultures  GI GI PROPHYLAXIS as indicated  NUTRITIONAL STATUS DIET-->Hold TF's Constipation protocol as indicated  ENDO - will use ICU hypoglycemic\Hyperglycemia protocol if indicated   ELECTROLYTES -follow labs as needed -replace as needed -pharmacy consultation and following   DVT/GI PRX ordered TRANSFUSIONS AS NEEDED MONITOR FSBS ASSESS the need for LABS as needed   Critical Care Time devoted to patient care services described in this note is 35  minutes.   Overall, patient is critically ill, prognosis is guarded.   Corrin Parker, M.D.  [facility]  Medical Director [place] Director [facility] Cardio-Pulmonary Department

## 2018-11-10 NOTE — Progress Notes (Signed)
[facility] - [place] at [facility]   PATIENT NAME: Angela Berry    MR#:  [id number]  DATE OF BIRTH:  1951/01/20  SUBJECTIVE:  CHIEF COMPLAINT:   Chief Complaint  Patient presents with  . Unresponsive   Continues to be on full ventilatory support.  Pressor support with Levophed. Sedated with propofol and fentanyl   REVIEW OF SYSTEMS:    Review of Systems  Unable to perform ROS: Intubated    DRUG ALLERGIES:  No Known Allergies  VITALS:  Blood pressure 100/60, pulse 76, temperature 99.1 F (37.3 C), temperature source Bladder, resp. rate (!) 22, height 5' 2.52" (1.588 m), weight 75.1 kg, SpO2 91 %.  PHYSICAL EXAMINATION:   Physical Exam  GENERAL:  68 y.o.-year-old patient lying in the bed ET tube in place.  Looks critically ill EYES: Pupils equal, round, reactive to light and accommodation. No scleral icterus. Extraocular muscles intact.  HEENT: Head atraumatic, normocephalic. Oropharynx and nasopharynx clear.  NECK:  Supple, no jugular venous distention. No thyroid enlargement, no tenderness.  LUNGS: Normal breath sounds bilaterally CARDIOVASCULAR: S1, S2 normal. No murmurs, rubs, or gallops.  ABDOMEN: Soft, nontender, nondistended. Bowel sounds present. No organomegaly or mass.  EXTREMITIES: No cyanosis, clubbing or edema b/l.     PSYCHIATRIC: The patient is sedated   LABORATORY PANEL:   CBC Recent Labs  Lab 11/10/18 0359  WBC 17.8*  HGB 14.2  HCT 43.9  PLT 251   ------------------------------------------------------------------------------------------------------------------ Chemistries  Recent Labs  Lab 11/10/18 0359  NA 136  K 4.8  CL 96*  CO2 30  GLUCOSE 140*  BUN 21  CREATININE 1.47*  CALCIUM 7.2*  MG 2.5*  AST 648*  ALT 1,131*  ALKPHOS 71  BILITOT 0.7   ------------------------------------------------------------------------------------------------------------------  Cardiac Enzymes Recent Labs  Lab 11/09/18 0646   TROPONINI 0.04*   ------------------------------------------------------------------------------------------------------------------  RADIOLOGY:  Dg Abd 1 View  Result Date: 11/09/2018 CLINICAL DATA:  Nasogastric tube placement EXAM: ABDOMEN - 1 VIEW COMPARISON:  11/08/2018 FINDINGS: The enteric tube projects over the gastric body. The tip is pointed distally. The side-hole projects over the GE junction. IMPRESSION: Stable positioning of the enteric tube with tip projecting over the gastric body. Electronically Signed   By: Katherine Mantlehristopher  Green M.D.   On: 11/09/2018 19:51   Ct Head Wo Contrast  Result Date: 11/08/2018 CLINICAL DATA:  Found unresponsive. EXAM: CT HEAD WITHOUT CONTRAST TECHNIQUE: Contiguous axial images were obtained from the base of the skull through the vertex without intravenous contrast. COMPARISON:  None. FINDINGS: Brain: Limited motion degraded scan. No evidence of parenchymal hemorrhage or extra-axial fluid collection. No mass lesion, mass effect, or midline shift. No CT evidence of acute infarction. Cerebral volume is age appropriate. No ventriculomegaly. Vascular: No acute abnormality. Skull: No evidence of calvarial fracture. Sinuses/Orbits: Fluid level in the right maxillary sinus. Mucoperiosteal thickening in the ethmoidal air cells and maxillary sinuses. Other:  The mastoid air cells are unopacified. IMPRESSION: 1. Limited motion degraded scan. 2. No evidence of acute intracranial abnormality. No evidence of calvarial fracture. 3. Mucoperiosteal thickening in paranasal sinuses with fluid level in the right maxillary sinus, cannot exclude acute sinusitis. Electronically Signed   By: Delbert PhenixJason A Poff M.D.   On: 11/08/2018 12:25   Portable Chest Xray  Result Date: 11/09/2018 CLINICAL DATA:  Respiratory failure EXAM: PORTABLE CHEST 1 VIEW COMPARISON:  11/08/2018 FINDINGS: Cardiac shadow is stable. Endotracheal tube and gastric catheter are again seen and stable. Lungs are well  aerated bilaterally. Very mild  interstitial changes are seen which are improved when compared with the prior exam. No new focal infiltrate or effusion is seen. No bony abnormality is noted. IMPRESSION: Slight improvement in the degree of interstitial edema. Electronically Signed   By: Alcide Clever M.D.   On: 11/09/2018 07:11   Dg Chest Portable 1 View  Result Date: 11/08/2018 CLINICAL DATA:  Respiratory difficulty EXAM: PORTABLE CHEST 1 VIEW COMPARISON:  1013 hours today FINDINGS: Endotracheal tube placed. Tip is 4.7 cm from the carina. NG tube placed with its tip beyond the gastric fundus. Stable vascular congestion. Basilar interstitial edema has improved. No pneumothorax. IMPRESSION: Endotracheal and NG tubes placed in appropriate position. Improving interstitial edema. Electronically Signed   By: Jolaine Click M.D.   On: 11/08/2018 13:20   Dg Chest Portable 1 View  Result Date: 11/08/2018 CLINICAL DATA:  Unresponsive EXAM: PORTABLE CHEST 1 VIEW COMPARISON:  08/30/2012 FINDINGS: The cardiac silhouette is accentuated by AP technique. It is likely normal in size. Vascular congestion. Mild interstitial edema is suspected with prominent interstitial markings towards the lung bases. No pneumothorax or pleural effusion. IMPRESSION: Vascular congestion and basilar interstitial edema. Electronically Signed   By: Jolaine Click M.D.   On: 11/08/2018 10:41   Dg Abd Portable 1 View  Result Date: 11/08/2018 CLINICAL DATA:  Evaluate OG tube placement EXAM: PORTABLE ABDOMEN - 1 VIEW COMPARISON:  Chest x-ray Nov 08, 2018 FINDINGS: The OG tube terminates between 2 and 3 cm below the GE junction. The distal tip is in the body of the stomach. IMPRESSION: The OG tube is in good position. Electronically Signed   By: Gerome Sam III M.D   On: 11/08/2018 13:22     ASSESSMENT AND PLAN:   Patient is a 68 year old female with history of asthma and COPD being admitted to the ICU for further management of acute  hypercapnic respiratory failure  *  Acute hypercapnic respiratory failure Patient with known history of COPD and asthma. Patient had to be intubated in the emergency room due to evidence of hypercapnic respiratory failure . Continue full ventilatory support.  *  Hyperkalemia  resolved  *  Septic shock Source of sepsis not yet found.  Thought to be possible aspiration pneumonia Continues on Levophed drip Placed on broad-spectrum IV antibiotics  Blood cultures with coag negative staph which is likely contaminant   *  Acute metabolic encephalopathy CT scan of the head normal.  Reassess once extubated.  *  Acute kidney injury Being hydrated with IV fluids.  Improving.  * Shock liver Follow AST, ALT and INR  *  Coagulopathy.  INR was 5.8 and trended down to 1.5 Due to shock liver  All the records are reviewed and case discussed with Care Management/Social Worker Management plans discussed with the patient, family and they are in agreement.  CODE STATUS: Full code  TOTAL TIME TAKING CARE OF THIS PATIENT: 35 minutes.   POSSIBLE D/C IN 4-5 DAYS, DEPENDING ON CLINICAL CONDITION.  Molinda Bailiff Yazmyne Sara M.D on 11/10/2018 at 9:42 AM  Between 7am to 6pm - Pager - [phone]  After 6pm go to [email or web address] - password [email or web address]  [facility]  Office  [phone]  CC: Primary care physician; Martie Round, NP  Note: This dictation was prepared with Dragon dictation along with smaller phrase technology. Any transcriptional errors that result from this process are unintentional.

## 2018-11-10 NOTE — Consult Note (Signed)
PHARMACY CONSULT NOTE - FOLLOW UP  Pharmacy Consult for Electrolyte Monitoring and Replacement   Recent Labs: Potassium (mmol/L)  Date Value  11/10/2018 4.8  08/30/2012 3.8   Magnesium (mg/dL)  Date Value  03/52/4818 2.5 (H)   Calcium (mg/dL)  Date Value  59/02/3111 7.2 (L)   Calcium, Total (mg/dL)  Date Value  16/24/4695 8.9   Albumin (g/dL)  Date Value  12/29/5748 3.0 (L)  08/30/2012 3.4   Phosphorus (mg/dL)  Date Value  51/83/3582 4.9 (H)   Sodium (mmol/L)  Date Value  11/10/2018 136  08/30/2012 132 (L)   Corrected Ca: 8.0 mg/dL  Assessment: 68 y.o. female with a history of COPD and asthma who was brought into the ED by EMS with report of passing out after drinking some wine the night before. She has noted electrolyte abnormalities  Goal of Therapy:  K ~ 4, magnesium ~2, phosphorous 2.5-4.6, calcium 8-10  Plan:  --No further electrolyte supplementation required for today --magnesium and phosphorous expected to self-resolve: f/u levels in am --F/U labs in the am and replace as needed  Lowella Bandy ,PharmD Clinical Pharmacist 11/10/2018 8:46 AM

## 2018-11-10 NOTE — Progress Notes (Signed)
Pharmacy Antibiotic Note  Angela Berry is a 68 y.o. female admitted on 11/08/2018. Patient admitted via EMS after being intoxicated and passing out. Patient intubated in ED for acute hypercapnic respiratory failure on 5/31. Patient initially started on vancomycin and cefepime. MRSA PCR is negative. Pharmacy was consulted for Unasyn dosing. Leukocytosis is slightly worse today. SCr still trending up: will continue to monitor  Plan: continue Unasyn 3g IV Q6hr to cover possible CAP and aspiration event.   Height: 5' 2.52" (158.8 cm) Weight: 165 lb 9.1 oz (75.1 kg) IBW/kg (Calculated) : 51.3  Temp (24hrs), Avg:98.8 F (37.1 C), Min:97.7 F (36.5 C), Max:99.7 F (37.6 C)  Recent Labs  Lab 11/08/18 1028 11/08/18 1138 11/08/18 1250 11/08/18 1355 11/08/18 1510 11/09/18 0506 11/09/18 0646 11/10/18 0359  WBC 15.2*  --   --   --   --  15.9*  --  17.8*  CREATININE  --   --  1.39*  --  1.36*  --  1.21* 1.47*  LATICACIDVEN  --  2.4*  --  2.1*  --   --   --  1.4    Estimated Creatinine Clearance: 36.6 mL/min (A) (by C-G formula based on SCr of 1.47 mg/dL (H)).    Antimicrobials this admission: Cefepime 5/31 >> 6/1  Vancomycin 5/31 >> 5/31  Unasyn 6/1 >>   Dose adjustments this admission: N/A  Microbiology results: 5/31 BCx: MSSS 2/4 5/31 MRSA PCR: negative  5/31 COVID: negative   Thank you for allowing pharmacy to be a part of this patient's care.  Lowella Bandy, PharmD 11/10/2018 8:50 AM

## 2018-11-11 ENCOUNTER — Inpatient Hospital Stay: Payer: Medicare HMO

## 2018-11-11 LAB — BASIC METABOLIC PANEL
Anion gap: 9 (ref 5–15)
BUN: 24 mg/dL — ABNORMAL HIGH (ref 8–23)
CO2: 30 mmol/L (ref 22–32)
Calcium: 7.7 mg/dL — ABNORMAL LOW (ref 8.9–10.3)
Chloride: 98 mmol/L (ref 98–111)
Creatinine, Ser: 1.24 mg/dL — ABNORMAL HIGH (ref 0.44–1.00)
GFR calc Af Amer: 53 mL/min — ABNORMAL LOW (ref >60)
GFR calc non Af Amer: 46 mL/min — ABNORMAL LOW (ref >60)
Glucose, Bld: 147 mg/dL — ABNORMAL HIGH (ref 70–99)
Potassium: 4.5 mmol/L (ref 3.5–5.1)
Sodium: 137 mmol/L (ref 135–145)

## 2018-11-11 LAB — GLUCOSE, CAPILLARY
Glucose-Capillary: 103 mg/dL — ABNORMAL HIGH (ref 70–99)
Glucose-Capillary: 137 mg/dL — ABNORMAL HIGH (ref 70–99)
Glucose-Capillary: 138 mg/dL — ABNORMAL HIGH (ref 70–99)
Glucose-Capillary: 149 mg/dL — ABNORMAL HIGH (ref 70–99)
Glucose-Capillary: 152 mg/dL — ABNORMAL HIGH (ref 70–99)
Glucose-Capillary: 96 mg/dL (ref 70–99)

## 2018-11-11 LAB — CULTURE, BLOOD (ROUTINE X 2): Special Requests: ADEQUATE

## 2018-11-11 LAB — CBC
HCT: 45.4 % (ref 36.0–46.0)
Hemoglobin: 14.2 g/dL (ref 12.0–15.0)
MCH: 30.1 pg (ref 26.0–34.0)
MCHC: 31.3 g/dL (ref 30.0–36.0)
MCV: 96.4 fL (ref 80.0–100.0)
Platelets: 263 10*3/uL (ref 150–400)
RBC: 4.71 MIL/uL (ref 3.87–5.11)
RDW: 16.9 % — ABNORMAL HIGH (ref 11.5–15.5)
WBC: 15.5 10*3/uL — ABNORMAL HIGH (ref 4.0–10.5)
nRBC: 0 % (ref 0.0–0.2)

## 2018-11-11 LAB — HEPATIC FUNCTION PANEL
ALT: 662 U/L — ABNORMAL HIGH (ref 0–44)
AST: 155 U/L — ABNORMAL HIGH (ref 15–41)
Albumin: 2.9 g/dL — ABNORMAL LOW (ref 3.5–5.0)
Alkaline Phosphatase: 62 U/L (ref 38–126)
Bilirubin, Direct: 0.2 mg/dL (ref 0.0–0.2)
Indirect Bilirubin: 0.4 mg/dL (ref 0.3–0.9)
Total Bilirubin: 0.6 mg/dL (ref 0.3–1.2)
Total Protein: 6.4 g/dL — ABNORMAL LOW (ref 6.5–8.1)

## 2018-11-11 LAB — MAGNESIUM: Magnesium: 2.8 mg/dL — ABNORMAL HIGH (ref 1.7–2.4)

## 2018-11-11 LAB — PHOSPHORUS: Phosphorus: 4.5 mg/dL (ref 2.5–4.6)

## 2018-11-11 LAB — AMMONIA: Ammonia: <9 umol/L — ABNORMAL LOW (ref 9–35)

## 2018-11-11 MED ORDER — DEXMEDETOMIDINE HCL IN NACL 400 MCG/100ML IV SOLN
0.4000 ug/kg/h | INTRAVENOUS | Status: DC
  Administered 2018-11-11: 0.8 ug/kg/h via INTRAVENOUS

## 2018-11-11 MED ORDER — FENTANYL CITRATE (PF) 100 MCG/2ML IJ SOLN
INTRAMUSCULAR | Status: AC
  Filled 2018-11-11: qty 2

## 2018-11-11 MED ORDER — PANTOPRAZOLE SODIUM 40 MG PO PACK
40.0000 mg | PACK | Freq: Two times a day (BID) | ORAL | Status: DC
  Administered 2018-11-11 (×2): 40 mg
  Filled 2018-11-11 (×2): qty 20

## 2018-11-11 MED ORDER — FENTANYL CITRATE (PF) 100 MCG/2ML IJ SOLN
50.0000 ug | Freq: Once | INTRAMUSCULAR | Status: AC
  Administered 2018-11-11: 50 ug via INTRAVENOUS

## 2018-11-11 MED ORDER — MIDAZOLAM HCL 2 MG/2ML IJ SOLN
2.0000 mg | Freq: Once | INTRAMUSCULAR | Status: AC
  Administered 2018-11-11: 2 mg via INTRAVENOUS

## 2018-11-11 MED ORDER — MIDAZOLAM HCL 2 MG/2ML IJ SOLN
INTRAMUSCULAR | Status: AC
  Filled 2018-11-11: qty 2

## 2018-11-11 NOTE — Progress Notes (Signed)
Kathlene November (brother) contact information [phone].

## 2018-11-11 NOTE — Progress Notes (Signed)
CRITICAL CARE NOTE  CC  follow up respiratory failure  SUBJECTIVE Patient remains critically ill Prognosis is guarded Poor prognosis Severe COPD Severe wheezing   BP 140/69   Pulse 83   Temp 97.7 F (36.5 C) (Axillary)   Resp 15   Ht 5' 2.52" (1.588 m)   Wt 75.3 kg   SpO2 96%   BMI 29.86 kg/m    I/O last 3 completed shifts: In: 4200.9 [I.V.:3124.9; NG/GT:676; IV Piggyback:400] Out: 1310 [Urine:1310] No intake/output data recorded.  SpO2: 96 % O2 Flow Rate (L/min): 5 L/min FiO2 (%): 40 %   SIGNIFICANT EVENTS 6/1 admitted for ICU for severe resp failure liver failure 6/2 failed SAT/SBT updated Brother MIke  REVIEW OF SYSTEMS  PATIENT IS UNABLE TO PROVIDE COMPLETE REVIEW OF SYSTEMS DUE TO SEVERE CRITICAL ILLNESS   PHYSICAL EXAMINATION:  GENERAL:critically ill appearing, +resp distress HEAD: Normocephalic, atraumatic.  EYES: Pupils equal, round, reactive to light.  No scleral icterus.  MOUTH: Moist mucosal membrane. NECK: Supple. No thyromegaly. No nodules. No JVD.  PULMONARY: +rhonchi, +wheezing CARDIOVASCULAR: S1 and S2. Regular rate and rhythm. No murmurs, rubs, or gallops.  GASTROINTESTINAL: Soft, nontender, -distended. No masses. Positive bowel sounds. No hepatosplenomegaly.  MUSCULOSKELETAL: No swelling, clubbing, or edema.  NEUROLOGIC: obtunded, GCS<8 SKIN:intact,warm,dry  MEDICATIONS: I have reviewed all medications and confirmed regimen as documented   CULTURE RESULTS   Recent Results (from the past 240 hour(s))  Blood culture (routine x 2)     Status: None (Preliminary result)   Collection Time: 11/08/18 11:38 AM  Result Value Ref Range Status   Specimen Description BLOOD RIGHT ANTECUBITAL  Final   Special Requests   Final    BOTTLES DRAWN AEROBIC AND ANAEROBIC Blood Culture adequate volume   Culture   Final    NO GROWTH 2 DAYS Performed at [facility], [address], [place], [place] [address]    Report Status PENDING   Incomplete  Blood culture (routine x 2)     Status: Abnormal (Preliminary result)   Collection Time: 11/08/18 11:38 AM  Result Value Ref Range Status   Specimen Description   Final    BLOOD LEFT ANTECUBITAL Performed at [facility], [address] [place], [place] [address]    Special Requests   Final    BOTTLES DRAWN AEROBIC AND ANAEROBIC Blood Culture adequate volume Performed at [facility], [address] [place], [place] [address]    Culture  Setup Time   Final    IN BOTH AEROBIC AND ANAEROBIC BOTTLES GRAM POSITIVE COCCI CRITICAL RESULT CALLED TO, READ BACK BY AND VERIFIED WITH: DAVID BESANTI AT [facility] ON 11/09/2018 JJB    Culture (A)  Final    STAPHYLOCOCCUS SPECIES (COAGULASE NEGATIVE) THE SIGNIFICANCE OF ISOLATING THIS ORGANISM FROM A SINGLE SET OF BLOOD CULTURES WHEN MULTIPLE SETS ARE DRAWN IS UNCERTAIN. PLEASE NOTIFY THE MICROBIOLOGY DEPARTMENT WITHIN ONE WEEK IF SPECIATION AND SENSITIVITIES ARE REQUIRED. Performed at [facility], [place] [address] [place], [place] [address]    Report Status PENDING  Incomplete  Blood Culture ID Panel (Reflexed)     Status: Abnormal   Collection Time: 11/08/18 11:38 AM  Result Value Ref Range Status   Enterococcus species NOT DETECTED NOT DETECTED Final   Listeria monocytogenes NOT DETECTED NOT DETECTED Final   Staphylococcus species DETECTED (A) NOT DETECTED Final    Comment: Methicillin (oxacillin) susceptible coagulase negative staphylococcus. Possible blood culture contaminant (unless isolated from more than one blood culture draw  or clinical case suggests pathogenicity). No antibiotic treatment is indicated for blood  culture contaminants. CRITICAL RESULT CALLED TO, READ BACK BY AND VERIFIED WITH: DAVID BESANTI AT 0326 ON 11/09/2018 JJB    Staphylococcus aureus (BCID) NOT DETECTED NOT DETECTED Final   Methicillin resistance NOT DETECTED NOT DETECTED Final   Streptococcus species NOT DETECTED NOT DETECTED  Final   Streptococcus agalactiae NOT DETECTED NOT DETECTED Final   Streptococcus pneumoniae NOT DETECTED NOT DETECTED Final   Streptococcus pyogenes NOT DETECTED NOT DETECTED Final   Acinetobacter baumannii NOT DETECTED NOT DETECTED Final   Enterobacteriaceae species NOT DETECTED NOT DETECTED Final   Enterobacter cloacae complex NOT DETECTED NOT DETECTED Final   Escherichia coli NOT DETECTED NOT DETECTED Final   Klebsiella oxytoca NOT DETECTED NOT DETECTED Final   Klebsiella pneumoniae NOT DETECTED NOT DETECTED Final   Proteus species NOT DETECTED NOT DETECTED Final   Serratia marcescens NOT DETECTED NOT DETECTED Final   Haemophilus influenzae NOT DETECTED NOT DETECTED Final   Neisseria meningitidis NOT DETECTED NOT DETECTED Final   Pseudomonas aeruginosa NOT DETECTED NOT DETECTED Final   Candida albicans NOT DETECTED NOT DETECTED Final   Candida glabrata NOT DETECTED NOT DETECTED Final   Candida krusei NOT DETECTED NOT DETECTED Final   Candida parapsilosis NOT DETECTED NOT DETECTED Final   Candida tropicalis NOT DETECTED NOT DETECTED Final    Comment: Performed at [facility], [address] [place], [place] [address]  SARS Coronavirus 2 (CEPHEID - Performed in [place] hospital lab), Hosp Order     Status: None   Collection Time: 11/08/18 12:22 PM  Result Value Ref Range Status   SARS Coronavirus 2 NEGATIVE NEGATIVE Final    Comment: (NOTE) If result is NEGATIVE SARS-CoV-2 target nucleic acids are NOT DETECTED. The SARS-CoV-2 RNA is generally detectable in upper and lower  respiratory specimens during the acute phase of infection. The lowest  concentration of SARS-CoV-2 viral copies this assay can detect is 250  copies / mL. A negative result does not preclude SARS-CoV-2 infection  and should not be used as the sole basis for treatment or other  patient management decisions.  A negative result may occur with  improper specimen collection / handling, submission of  specimen other  than nasopharyngeal swab, presence of viral mutation(s) within the  areas targeted by this assay, and inadequate number of viral copies  (<250 copies / mL). A negative result must be combined with clinical  observations, patient history, and epidemiological information. If result is POSITIVE SARS-CoV-2 target nucleic acids are DETECTED. The SARS-CoV-2 RNA is generally detectable in upper and lower  respiratory specimens dur ing the acute phase of infection.  Positive  results are indicative of active infection with SARS-CoV-2.  Clinical  correlation with patient history and other diagnostic information is  necessary to determine patient infection status.  Positive results do  not rule out bacterial infection or co-infection with other viruses. If result is PRESUMPTIVE POSTIVE SARS-CoV-2 nucleic acids MAY BE PRESENT.   A presumptive positive result was obtained on the submitted specimen  and confirmed on repeat testing.  While 2019 novel coronavirus  (SARS-CoV-2) nucleic acids may be present in the submitted sample  additional confirmatory testing may be necessary for epidemiological  and / or clinical management purposes  to differentiate between  SARS-CoV-2 and other Sarbecovirus currently known to infect humans.  If clinically indicated additional testing with an alternate test  methodology [phone]) is advised. The SARS-CoV-2 RNA is generally  detectable in upper and lower respiratory sp ecimens during the acute  phase of infection. The expected result is Negative. Fact Sheet for Patients:  StrictlyIdeas.no Fact Sheet for Healthcare Providers: BankingDealers.co.za This test is not yet approved or cleared by the [place] FDA and has been authorized for detection and/or diagnosis of SARS-CoV-2 by FDA under an Emergency Use Authorization (EUA).  This EUA will remain in effect (meaning this test can be used) for the  duration of the COVID-19 declaration under Section 564(b)(1) of the Act, 21 U.S.C. section 360bbb-3(b)(1), unless the authorization is terminated or revoked sooner. Performed at [facility], [address] [place], [place] [address]   MRSA PCR Screening     Status: None   Collection Time: 11/08/18  7:39 PM  Result Value Ref Range Status   MRSA by PCR NEGATIVE NEGATIVE Final    Comment:        The GeneXpert MRSA Assay (FDA approved for NASAL specimens only), is one component of a comprehensive MRSA colonization surveillance program. It is not intended to diagnose MRSA infection nor to guide or monitor treatment for MRSA infections. Performed at [facility], [address] [place], [place] [address]         CBC    Component Value Date/Time   WBC 15.5 (H) 11/11/2018 0420   RBC 4.71 11/11/2018 0420   HGB 14.2 11/11/2018 0420   HGB 12.9 08/30/2012 0617   HCT 45.4 11/11/2018 0420   HCT 37.5 08/30/2012 0617   PLT 263 11/11/2018 0420   PLT 212 08/30/2012 0617   MCV 96.4 11/11/2018 0420   MCV 93 08/30/2012 0617   MCH 30.1 11/11/2018 0420   MCHC 31.3 11/11/2018 0420   RDW 16.9 (H) 11/11/2018 0420   RDW 13.6 08/30/2012 0617   LYMPHSABS 0.6 (L) 11/10/2018 0359   MONOABS 0.8 11/10/2018 0359   EOSABS 0.0 11/10/2018 0359   BASOSABS 0.0 11/10/2018 0359   BMP Latest Ref Rng & Units 11/11/2018 11/10/2018 11/09/2018  Glucose 70 - 99 mg/dL 147(H) 140(H) 228(H)  BUN 8 - 23 mg/dL 24(H) 21 17  Creatinine 0.44 - 1.00 mg/dL 1.24(H) 1.47(H) 1.21(H)  Sodium 135 - 145 mmol/L 137 136 135  Potassium 3.5 - 5.1 mmol/L 4.5 4.8 3.1(L)  Chloride 98 - 111 mmol/L 98 96(L) 94(L)  CO2 22 - 32 mmol/L 30 30 32  Calcium 8.9 - 10.3 mg/dL 7.7(L) 7.2(L) 6.6(L)        Indwelling Urinary Catheter continued, requirement due to   Reason to continue Indwelling Urinary Catheter strict Intake/Output monitoring for hemodynamic instability   Central Line/ continued, requirement due to   Reason to continue [place] of central venous pressure or other hemodynamic parameters and poor IV access   Ventilator continued, requirement due to severe respiratory failure   Ventilator Sedation RASS 0 to -2      ASSESSMENT AND PLAN SYNOPSIS  Admitted for acute severe hypoxic resp failure from ETOH abuse and Narcotic OD with acute and severe COPD exacerbation aspiration pneumonia associated with cannibus use and active smoking     Severe ACUTE Hypoxic and Hypercapnic Respiratory Failure -continue Full MV support -continue Bronchodilator Therapy -Wean Fio2 and PEEP as tolerated -will perform SAT/SBT when respiratory parameters are met  ACUTE SYSTOLIC CARDIAC FAILURE- EF -oxygen as needed -Lasix as tolerated -follow up cardiac enzymes as indicated -follow up cardiology recs  SEVERE COPD EXACERBATION -continue IV steroids as prescribed -continue NEB THERAPY as prescribed -morphine as needed -wean  fio2 as needed and tolerated    NEUROLOGY - intubated and sedated - minimal sedation to achieve a RASS goal: -1 Wake up assessment pending   CARDIAC ICU monitoring  ID -continue IV abx as prescibed -follow up cultures  GI GI PROPHYLAXIS as indicated  NUTRITIONAL STATUS DIET-->TF's as tolerated Constipation protocol as indicated  ENDO - will use ICU hypoglycemic\Hyperglycemia protocol if indicated   ELECTROLYTES -follow labs as needed -replace as needed -pharmacy consultation and following   DVT/GI PRX ordered TRANSFUSIONS AS NEEDED MONITOR FSBS ASSESS the need for LABS as needed   Critical Care Time devoted to patient care services described in this note is 34 minutes.   Overall, patient is critically ill, prognosis is guarded.  Patient with Multiorgan failure and at high risk for cardiac arrest and death.    Corrin Parker, M.D.  [facility]  Medical Director [place]  Director [facility] Cardio-Pulmonary Department

## 2018-11-11 NOTE — Consult Note (Signed)
PHARMACY CONSULT NOTE - FOLLOW UP  Pharmacy Consult for Electrolyte Monitoring and Replacement   Recent Labs: Potassium (mmol/L)  Date Value  11/11/2018 4.5  08/30/2012 3.8   Magnesium (mg/dL)  Date Value  13/01/6577 2.8 (H)   Calcium (mg/dL)  Date Value  46/96/2952 7.7 (L)   Calcium, Total (mg/dL)  Date Value  84/13/2440 8.9   Albumin (g/dL)  Date Value  04/06/2535 3.0 (L)  08/30/2012 3.4   Phosphorus (mg/dL)  Date Value  64/40/3474 4.5   Sodium (mmol/L)  Date Value  11/11/2018 137  08/30/2012 132 (L)   Corrected Ca: 9.7 mg/dL  Assessment: 68 y.o. female with a history of COPD and asthma who was brought into the ED by EMS with report of passing out after drinking some wine the night before. She has noted electrolyte abnormalities  Goal of Therapy:  K ~ 4, magnesium ~2, phosphorous 2.5-4.6, calcium 8-10  Plan:  --No further electrolyte supplementation required for today --magnesium expected to self-resolve: f/u levels in am --F/U labs in the am and replace as needed  Lowella Bandy ,PharmD Clinical Pharmacist 11/11/2018 9:07 AM

## 2018-11-11 NOTE — Progress Notes (Signed)
Pharmacy Antibiotic Note  Angela Berry is a 68 y.o. female admitted on 11/08/2018. Patient admitted via EMS after being intoxicated and passing out. Patient intubated in ED for acute hypercapnic respiratory failure on 5/31. Patient initially started on vancomycin and cefepime. MRSA PCR is negative. Pharmacy was consulted for Unasyn dosing. Leukocytosis is slightly improved today and she has been afebrile since the previous note. SCr was trending up but is lower today, although not yet back to baseline: will continue to monitor. This is day number 4 of antibiotics and day number 3 of Unasyn.  Plan: continue Unasyn 3g IV Q6hr to cover possible CAP and aspiration event.   Height: 5' 2.52" (158.8 cm) Weight: 166 lb 0.1 oz (75.3 kg) IBW/kg (Calculated) : 51.3  Temp (24hrs), Avg:98.5 F (36.9 C), Min:97.7 F (36.5 C), Max:99.7 F (37.6 C)  Recent Labs  Lab 11/08/18 1028 11/08/18 1138 11/08/18 1250 11/08/18 1355 11/08/18 1510 11/09/18 0506 11/09/18 0646 11/10/18 0359 11/11/18 0420  WBC 15.2*  --   --   --   --  15.9*  --  17.8* 15.5*  CREATININE  --   --  1.39*  --  1.36*  --  1.21* 1.47* 1.24*  LATICACIDVEN  --  2.4*  --  2.1*  --   --   --  1.4  --     Estimated Creatinine Clearance: 43.5 mL/min (A) (by C-G formula based on SCr of 1.24 mg/dL (H)).    Antimicrobials this admission: Cefepime 5/31 >> 6/1  Vancomycin 5/31 >> 5/31  Unasyn 6/1 >>   Dose adjustments this admission: N/A  Microbiology results: 5/31 BCx: MSSS 2/4 5/31 MRSA PCR: negative  5/31 COVID: negative   Thank you for allowing pharmacy to be a part of this patient's care.  Lowella Bandy, PharmD 11/11/2018 11:29 AM

## 2018-11-11 NOTE — Progress Notes (Signed)
[employer] - [place] [facility]   PATIENT NAME: Angela Berry    MR#:  [id number]  DATE OF BIRTH:  Apr 03, 1951  SUBJECTIVE:  CHIEF COMPLAINT:   Chief Complaint  Patient presents with  . Unresponsive   Sedated on the vent. REVIEW OF SYSTEMS:  ROS Unobtainable  DRUG ALLERGIES:  No Known Allergies VITALS:  Blood pressure (!) 87/55, pulse 89, temperature 98.5 F (36.9 C), temperature source Oral, resp. rate 15, height 5' 2.52" (1.588 m), weight 75.3 kg, SpO2 95 %. PHYSICAL EXAMINATION:  Physical Exam   GENERAL:  68 y.o.-year-old patient lying in the bed ET tube in place.  Looks critically ill EYES: Pupils equal, round, reactive to light and accommodation. No scleral icterus. Extraocular muscles intact.  HEENT: Head atraumatic, normocephalic. Oropharynx and nasopharynx clear.  NECK:  Supple, no jugular venous distention. No thyroid enlargement, no tenderness.  LUNGS: Normal breath sounds bilaterally CARDIOVASCULAR: S1, S2 normal. No murmurs, rubs, or gallops.  ABDOMEN: Soft, nontender, nondistended. Bowel sounds present. No organomegaly or mass.  NEURO: Sedated on the vent EXTREMITIES: No cyanosis, clubbing or edema b/l.     PSYCHIATRIC: The patient is sedated  LABORATORY PANEL:  Female CBC Recent Labs  Lab 11/11/18 0420  WBC 15.5*  HGB 14.2  HCT 45.4  PLT 263   ------------------------------------------------------------------------------------------------------------------ Chemistries  Recent Labs  Lab 11/11/18 0420 11/11/18 1420  NA 137  --   K 4.5  --   CL 98  --   CO2 30  --   GLUCOSE 147*  --   BUN 24*  --   CREATININE 1.24*  --   CALCIUM 7.7*  --   MG 2.8*  --   AST  --  155*  ALT  --  662*  ALKPHOS  --  62  BILITOT  --  0.6   RADIOLOGY:  No results found. ASSESSMENT AND PLAN:   Patient is a 68 year old female with history of asthma and COPD being admitted to the ICU for further management of acute hypercapnic respiratory  failure  *Acute hypercapnic respiratory failure Patient with known history of COPD and asthma. Patient had to be intubated in the emergency room due to evidence of hypercapnic respiratory failure . Continue full ventilatory support. Mgt by ICU team  *Hyperkalemia  resolved  *Septic shock Likely due to aspiration pneumonia; On Unasyn Patient was hypotensive and hypothermic. Requiring Levophed drip at this time and  Placed on broad-spectrum IV antibiotics with vancomycin and cefepime. Blood cultures with coag negative staph which is likely contaminant   *Acute metabolic encephalopathy CT scan of the head normal.  Reassess once extubated.  *Acute kidney injury Being hydrated with IV fluids.  Improving.  * Shock liver LFTs trending down Follow AST, ALT and INR  *Coagulopathy. INR was 5.8. Down to 1.5  Due to shock liver  DVT proph On SCDs due to recent coagulopather Defer to ICU team   All the records are reviewed and case discussed with Care Management/Social Worker. Management plans discussed with the patient, family and they are in agreement.  CODE STATUS: Full Code  TOTAL TIME TAKING CARE OF THIS PATIENT: 39 minutes.   More than 50% of the time was spent in counseling/coordination of care: YES  POSSIBLE D/C IN 3 DAYS, DEPENDING ON CLINICAL CONDITION.   Kieren Adkison M.D on 11/11/2018 at 4:21 PM  Between 7am to 6pm - Pager - [phone]  After 6pm go to [email or web address] - password [email or web address] [facility]  [employer] [facility]  [place] [facility]  Office  [phone]  CC: Primary care physician; Martie RoundSpencer, Nicole, NP  Note: This dictation was prepared with Dragon dictation along with smaller phrase technology. Any transcriptional errors that result from this process are unintentional.

## 2018-11-11 NOTE — Progress Notes (Signed)
Several attempts have been made on a SBT. She makes no effort to breathe on her own and ETC02 increases to  the high 70s. The sedation will remain off.

## 2018-11-12 LAB — CBC WITH DIFFERENTIAL/PLATELET
Abs Immature Granulocytes: 0.04 10*3/uL (ref 0.00–0.07)
Basophils Absolute: 0 10*3/uL (ref 0.0–0.1)
Basophils Relative: 0 %
Eosinophils Absolute: 0 10*3/uL (ref 0.0–0.5)
Eosinophils Relative: 0 %
HCT: 42.2 % (ref 36.0–46.0)
Hemoglobin: 13.1 g/dL (ref 12.0–15.0)
Immature Granulocytes: 0 %
Lymphocytes Relative: 7 %
Lymphs Abs: 0.6 10*3/uL — ABNORMAL LOW (ref 0.7–4.0)
MCH: 30.3 pg (ref 26.0–34.0)
MCHC: 31 g/dL (ref 30.0–36.0)
MCV: 97.7 fL (ref 80.0–100.0)
Monocytes Absolute: 0.6 10*3/uL (ref 0.1–1.0)
Monocytes Relative: 7 %
Neutro Abs: 8 10*3/uL — ABNORMAL HIGH (ref 1.7–7.7)
Neutrophils Relative %: 86 %
Platelets: 208 10*3/uL (ref 150–400)
RBC: 4.32 MIL/uL (ref 3.87–5.11)
RDW: 17.1 % — ABNORMAL HIGH (ref 11.5–15.5)
WBC: 9.2 10*3/uL (ref 4.0–10.5)
nRBC: 0 % (ref 0.0–0.2)

## 2018-11-12 LAB — BASIC METABOLIC PANEL
Anion gap: 8 (ref 5–15)
BUN: 30 mg/dL — ABNORMAL HIGH (ref 8–23)
CO2: 32 mmol/L (ref 22–32)
Calcium: 8.2 mg/dL — ABNORMAL LOW (ref 8.9–10.3)
Chloride: 100 mmol/L (ref 98–111)
Creatinine, Ser: 0.93 mg/dL (ref 0.44–1.00)
GFR calc Af Amer: >60 mL/min (ref >60)
GFR calc non Af Amer: >60 mL/min (ref >60)
Glucose, Bld: 131 mg/dL — ABNORMAL HIGH (ref 70–99)
Potassium: 4.6 mmol/L (ref 3.5–5.1)
Sodium: 140 mmol/L (ref 135–145)

## 2018-11-12 LAB — GLUCOSE, CAPILLARY
Glucose-Capillary: 100 mg/dL — ABNORMAL HIGH (ref 70–99)
Glucose-Capillary: 115 mg/dL — ABNORMAL HIGH (ref 70–99)
Glucose-Capillary: 119 mg/dL — ABNORMAL HIGH (ref 70–99)
Glucose-Capillary: 122 mg/dL — ABNORMAL HIGH (ref 70–99)
Glucose-Capillary: 83 mg/dL (ref 70–99)
Glucose-Capillary: 93 mg/dL (ref 70–99)

## 2018-11-12 LAB — MAGNESIUM: Magnesium: 2.9 mg/dL — ABNORMAL HIGH (ref 1.7–2.4)

## 2018-11-12 MED ORDER — PANTOPRAZOLE SODIUM 40 MG IV SOLR
40.0000 mg | Freq: Two times a day (BID) | INTRAVENOUS | Status: DC
  Administered 2018-11-12 – 2018-11-14 (×5): 40 mg via INTRAVENOUS
  Filled 2018-11-12 (×5): qty 40

## 2018-11-12 MED ORDER — BISACODYL 10 MG RE SUPP
10.0000 mg | Freq: Every day | RECTAL | Status: DC | PRN
  Administered 2018-11-12: 10 mg via RECTAL
  Filled 2018-11-12: qty 1

## 2018-11-12 MED ORDER — LORAZEPAM 2 MG/ML IJ SOLN
INTRAMUSCULAR | Status: AC
  Filled 2018-11-12: qty 1

## 2018-11-12 MED ORDER — LORAZEPAM 2 MG/ML IJ SOLN
2.0000 mg | Freq: Once | INTRAMUSCULAR | Status: AC
  Administered 2018-11-12: 2 mg via INTRAVENOUS

## 2018-11-12 MED ORDER — IPRATROPIUM-ALBUTEROL 0.5-2.5 (3) MG/3ML IN SOLN
3.0000 mL | Freq: Four times a day (QID) | RESPIRATORY_TRACT | Status: DC | PRN
  Filled 2018-11-12: qty 3

## 2018-11-12 MED ORDER — METHYLPREDNISOLONE SODIUM SUCC 40 MG IJ SOLR
40.0000 mg | Freq: Four times a day (QID) | INTRAMUSCULAR | Status: DC
  Administered 2018-11-13 – 2018-11-16 (×14): 40 mg via INTRAVENOUS
  Filled 2018-11-12 (×14): qty 1

## 2018-11-12 MED ORDER — METHYLPREDNISOLONE SODIUM SUCC 40 MG IJ SOLR
40.0000 mg | Freq: Once | INTRAMUSCULAR | Status: AC
  Administered 2018-11-12: 19:00:00 40 mg via INTRAVENOUS

## 2018-11-12 NOTE — Progress Notes (Signed)
Attempted bedside swallow evaluation. Patient cleared throat multiple times. Per Sonda Rumble, NP okay to place ST consult. Angela Berry

## 2018-11-12 NOTE — Consult Note (Signed)
PHARMACY CONSULT NOTE - FOLLOW UP  Pharmacy Consult for Electrolyte Monitoring and Replacement   Recent Labs: Potassium (mmol/L)  Date Value  11/12/2018 4.6  08/30/2012 3.8   Magnesium (mg/dL)  Date Value  81/44/8185 2.9 (H)   Calcium (mg/dL)  Date Value  63/14/9702 8.2 (L)   Calcium, Total (mg/dL)  Date Value  63/78/5885 8.9   Albumin (g/dL)  Date Value  02/77/4128 2.9 (L)  08/30/2012 3.4   Phosphorus (mg/dL)  Date Value  78/67/6720 4.5   Sodium (mmol/L)  Date Value  11/12/2018 140  08/30/2012 132 (L)   Corrected Ca: 9.7 mg/dL  Assessment: 68 y.o. female with a history of COPD and asthma who was brought into the ED by EMS with report of passing out after drinking some wine the night before. She has noted electrolyte abnormalities  Goal of Therapy:  K ~ 4, magnesium ~2, phosphorous 2.5-4.6, calcium 8-10  Plan:  --No further electrolyte supplementation required for today --magnesium expected to self-resolve: f/u levels in am --F/U labs in the am and replace as needed  Jamika Sadek L ,PharmD Clinical Pharmacist 11/12/2018 6:49 PM

## 2018-11-12 NOTE — Progress Notes (Signed)
Pharmacy Antibiotic Note  Angela Berry is a 68 y.o. female admitted on 11/08/2018. Patient admitted via EMS after being intoxicated and passing out. Patient intubated in ED for acute hypercapnic respiratory failure on 5/31. Patient initially started on vancomycin and cefepime. MRSA PCR is negative. Pharmacy was consulted for Unasyn dosing. Leukocytosis is slightly improved today and she has been afebrile since the previous note. SCr was trending up but is lower today, although not yet back to baseline: will continue to monitor. This is day number 4 of antibiotics and day number 3 of Unasyn.  Plan: continue Unasyn 3g IV Q6hr to cover possible CAP and aspiration event.   Height: 5' 2.52" (158.8 cm) Weight: 170 lb 3.1 oz (77.2 kg) IBW/kg (Calculated) : 51.3  Temp (24hrs), Avg:98 F (36.7 C), Min:97.4 F (36.3 C), Max:98.6 F (37 C)  Recent Labs  Lab 11/08/18 1028 11/08/18 1138  11/08/18 1355 11/08/18 1510 11/09/18 0506 11/09/18 0646 11/10/18 0359 11/11/18 0420 11/12/18 0346  WBC 15.2*  --   --   --   --  15.9*  --  17.8* 15.5* 9.2  CREATININE  --   --    < >  --  1.36*  --  1.21* 1.47* 1.24* 0.93  LATICACIDVEN  --  2.4*  --  2.1*  --   --   --  1.4  --   --    < > = values in this interval not displayed.    Estimated Creatinine Clearance: 58.7 mL/min (by C-G formula based on SCr of 0.93 mg/dL).    Antimicrobials this admission: Cefepime 5/31 >> 6/1  Vancomycin 5/31 >> 5/31  Unasyn 6/1 >>   Dose adjustments this admission: N/A  Microbiology results: 5/31 BCx: MSSS 2/4 5/31 MRSA PCR: negative  5/31 COVID: negative   Thank you for allowing pharmacy to be a part of this patient's care.  Jazline Cumbee L, PharmD 11/12/2018 6:50 PM

## 2018-11-12 NOTE — Progress Notes (Signed)
Pt was suctioned prior to extubation. Per Dr. Clovis Fredrickson order, she was extubated and placed on 4L nasal cannula.

## 2018-11-12 NOTE — Progress Notes (Signed)
[employer] - [place] at [facility]   PATIENT NAME: Angela LoreSusan Berry    MR#:  [id number]  DATE OF BIRTH:  [birth year]  SUBJECTIVE:  CHIEF COMPLAINT:   Chief Complaint  Patient presents with  . Unresponsive   Sedated on the vent. REVIEW OF SYSTEMS:  ROS Unobtainable  DRUG ALLERGIES:  No Known Allergies VITALS:  Blood pressure (!) 141/79, pulse (!) 118, temperature 98.6 F (37 C), temperature source Oral, resp. rate 17, height 5' 2.52" (1.588 m), weight 77.2 kg, SpO2 96 %. PHYSICAL EXAMINATION:  Physical Exam   GENERAL:  68 y.o.-year-old patient lying in the bed ET tube in place.  Looks critically ill EYES: Pupils equal, round, reactive to light and accommodation. No scleral icterus. Extraocular muscles intact.  HEENT: Head atraumatic, normocephalic. Oropharynx and nasopharynx clear.  NECK:  Supple, no jugular venous distention. No thyroid enlargement, no tenderness.  LUNGS: Normal breath sounds bilaterally CARDIOVASCULAR: S1, S2 normal. No murmurs, rubs, or gallops.  ABDOMEN: Soft, nontender, nondistended. Bowel sounds present. No organomegaly or mass.  NEURO: Sedated on the vent EXTREMITIES: No cyanosis, clubbing or edema b/l.     PSYCHIATRIC: The patient is sedated  LABORATORY PANEL:  Female CBC Recent Labs  Lab 11/12/18 0346  WBC 9.2  HGB 13.1  HCT 42.2  PLT 208   ------------------------------------------------------------------------------------------------------------------ Chemistries  Recent Labs  Lab 11/11/18 1420 11/12/18 0346  NA  --  140  K  --  4.6  CL  --  100  CO2  --  32  GLUCOSE  --  131*  BUN  --  30*  CREATININE  --  0.93  CALCIUM  --  8.2*  MG  --  2.9*  AST 155*  --   ALT 662*  --   ALKPHOS 62  --   BILITOT 0.6  --    RADIOLOGY:  Dg Chest Port 1 View  Result Date: 11/11/2018 CLINICAL DATA:  Acute respiratory failure EXAM: PORTABLE CHEST 1 VIEW COMPARISON:  11/09/2018 FINDINGS: The endotracheal tube terminates  above the carina. The enteric tube extends below the left hemidiaphragm. There is a developing right lower lobe airspace opacity and pleural effusion. There is no pneumothorax. There is volume overload. The cardiac size is stable. There is a small left-sided pleural effusion. IMPRESSION: 1. Worsening right lower lobe opacity may represent a combination of an infiltrate and small right-sided pleural effusion. 2. Endotracheal tube terminates above the carina. 3. Persistent interstitial pulmonary edema. Electronically Signed   By: Katherine Mantlehristopher  Green M.D.   On: 11/11/2018 20:42   ASSESSMENT AND PLAN:   Patient is a 68 year old female with history of asthma and COPD being admitted to the ICU for further management of acute hypercapnic respiratory failure  *Acute hypercapnic respiratory failure Patient with known history of COPD and asthma. Patient had to be intubated in the emergency room due to evidence of hypercapnic respiratory failure . Continue full ventilatory support. Mgt by ICU team  *Hyperkalemia  resolved  *Septic shock Likely due to aspiration pneumonia; On Unasyn Patient was hypotensive and hypothermic. Requiring Levophed drip and  Placed on broad-spectrum IV antibiotics with vancomycin and cefepime. Blood cultures with coag negative staph which is likely contaminant   *Acute metabolic encephalopathy CT scan of the head normal.  Reassess once extubated.  *Acute kidney injury Being hydrated with IV fluids.  Improving.  * Shock liver LFTs trending down Follow AST, ALT and INR  *Coagulopathy. INR was 5.8. Down to 1.5  Due to  shock liver  DVT proph On SCDs due to recent coagulopather Defer to ICU team   All the records are reviewed and case discussed with Care Management/Social Worker. Management plans discussed with the patient, family and they are in agreement.  CODE STATUS: Full Code  TOTAL TIME TAKING CARE OF THIS PATIENT: 38 minutes.   More  than 50% of the time was spent in counseling/coordination of care: YES  POSSIBLE D/C IN 3 DAYS, DEPENDING ON CLINICAL CONDITION.   Seanne Chirico M.D on 11/12/2018 at 3:13 PM  Between 7am to 6pm - Pager - [phone]  After 6pm go to [email or web address] - Social research officer, government  [facility] [place] [facility]  Office  [phone]  CC: Primary care physician; Martie Round, NP  Note: This dictation was prepared with Dragon dictation along with smaller phrase technology. Any transcriptional errors that result from this process are unintentional.

## 2018-11-12 NOTE — Progress Notes (Signed)
CRITICAL CARE NOTE  CC  follow up respiratory failure  SUBJECTIVE Patient remains critically ill Prognosis is guarded Poor prognosis Severe COPD Severe wheezing Failed SAT/SBT   BP 117/68   Pulse 99   Temp (!) 97.4 F (36.3 C) (Axillary)   Resp 15   Ht 5' 2.52" (1.588 m)   Wt 77.2 kg   SpO2 96%   BMI 30.61 kg/m    I/O last 3 completed shifts: In: 2594.7 [I.V.:1434.7; NG/GT:460; IV Piggyback:700] Out: 3220 [Urine:1420] No intake/output data recorded.  SpO2: 96 % O2 Flow Rate (L/min): 5 L/min FiO2 (%): 40 %   SIGNIFICANT EVENTS 6/1 admitted for ICU for severe resp failure liver failure 6/2 failed SAT/SBT updated Brother MIke 6/3 failed SAT/SBT   REVIEW OF SYSTEMS  PATIENT IS UNABLE TO PROVIDE COMPLETE REVIEW OF SYSTEMS DUE TO SEVERE CRITICAL ILLNESS   PHYSICAL EXAMINATION:  GENERAL:critically ill appearing, +resp distress HEAD: Normocephalic, atraumatic.  EYES: Pupils equal, round, reactive to light.  No scleral icterus.  MOUTH: Moist mucosal membrane. NECK: Supple. No thyromegaly. No nodules. No JVD.  PULMONARY: +rhonchi, +wheezing CARDIOVASCULAR: S1 and S2. Regular rate and rhythm. No murmurs, rubs, or gallops.  GASTROINTESTINAL: Soft, nontender, -distended. No masses. Positive bowel sounds. No hepatosplenomegaly.  MUSCULOSKELETAL: No swelling, clubbing, or edema.  NEUROLOGIC: obtunded, GCS<8 SKIN:intact,warm,dry  MEDICATIONS: I have reviewed all medications and confirmed regimen as documented   CULTURE RESULTS   Recent Results (from the past 240 hour(s))  Blood culture (routine x 2)     Status: None (Preliminary result)   Collection Time: 11/08/18 11:38 AM  Result Value Ref Range Status   Specimen Description BLOOD RIGHT ANTECUBITAL  Final   Special Requests   Final    BOTTLES DRAWN AEROBIC AND ANAEROBIC Blood Culture adequate volume   Culture   Final    NO GROWTH 4 DAYS Performed at [facility], [address] [place],  [place] [address]    Report Status PENDING  Incomplete  Blood culture (routine x 2)     Status: Abnormal   Collection Time: 11/08/18 11:38 AM  Result Value Ref Range Status   Specimen Description   Final    BLOOD LEFT ANTECUBITAL Performed at [facility], [address] [place], [place] [address]    Special Requests   Final    BOTTLES DRAWN AEROBIC AND ANAEROBIC Blood Culture adequate volume Performed at [facility], [address] [place], [place] [address]    Culture  Setup Time   Final    IN BOTH AEROBIC AND ANAEROBIC BOTTLES GRAM POSITIVE COCCI CRITICAL RESULT CALLED TO, READ BACK BY AND VERIFIED WITH: DAVID BESANTI AT [place] ON 11/09/2018 JJB    Culture (A)  Final    STAPHYLOCOCCUS SPECIES (COAGULASE NEGATIVE) THE SIGNIFICANCE OF ISOLATING THIS ORGANISM FROM A SINGLE SET OF BLOOD CULTURES WHEN MULTIPLE SETS ARE DRAWN IS UNCERTAIN. PLEASE NOTIFY THE MICROBIOLOGY DEPARTMENT WITHIN ONE WEEK IF SPECIATION AND SENSITIVITIES ARE REQUIRED. Performed at [facility], [place] [address], [place], [place] [address]    Report Status 11/11/2018 FINAL  Final  Blood Culture ID Panel (Reflexed)     Status: Abnormal   Collection Time: 11/08/18 11:38 AM  Result Value Ref Range Status   Enterococcus species NOT DETECTED NOT DETECTED Final   Listeria monocytogenes NOT DETECTED NOT DETECTED Final   Staphylococcus species DETECTED (A) NOT DETECTED Final    Comment: Methicillin (oxacillin) susceptible coagulase negative staphylococcus. Possible blood culture contaminant (unless isolated from  more than one blood culture draw or clinical case suggests pathogenicity). No antibiotic treatment is indicated for blood  culture contaminants. CRITICAL RESULT CALLED TO, READ BACK BY AND VERIFIED WITH: DAVID BESANTI AT 0326 ON 11/09/2018 JJB    Staphylococcus aureus (BCID) NOT DETECTED NOT DETECTED Final   Methicillin resistance NOT DETECTED NOT DETECTED Final   Streptococcus species NOT  DETECTED NOT DETECTED Final   Streptococcus agalactiae NOT DETECTED NOT DETECTED Final   Streptococcus pneumoniae NOT DETECTED NOT DETECTED Final   Streptococcus pyogenes NOT DETECTED NOT DETECTED Final   Acinetobacter baumannii NOT DETECTED NOT DETECTED Final   Enterobacteriaceae species NOT DETECTED NOT DETECTED Final   Enterobacter cloacae complex NOT DETECTED NOT DETECTED Final   Escherichia coli NOT DETECTED NOT DETECTED Final   Klebsiella oxytoca NOT DETECTED NOT DETECTED Final   Klebsiella pneumoniae NOT DETECTED NOT DETECTED Final   Proteus species NOT DETECTED NOT DETECTED Final   Serratia marcescens NOT DETECTED NOT DETECTED Final   Haemophilus influenzae NOT DETECTED NOT DETECTED Final   Neisseria meningitidis NOT DETECTED NOT DETECTED Final   Pseudomonas aeruginosa NOT DETECTED NOT DETECTED Final   Candida albicans NOT DETECTED NOT DETECTED Final   Candida glabrata NOT DETECTED NOT DETECTED Final   Candida krusei NOT DETECTED NOT DETECTED Final   Candida parapsilosis NOT DETECTED NOT DETECTED Final   Candida tropicalis NOT DETECTED NOT DETECTED Final    Comment: Performed at [facility], [address] [place], [place] [address]  SARS Coronavirus 2 (CEPHEID - Performed in [place] hospital lab), Hosp Order     Status: None   Collection Time: 11/08/18 12:22 PM  Result Value Ref Range Status   SARS Coronavirus 2 NEGATIVE NEGATIVE Final    Comment: (NOTE) If result is NEGATIVE SARS-CoV-2 target nucleic acids are NOT DETECTED. The SARS-CoV-2 RNA is generally detectable in upper and lower  respiratory specimens during the acute phase of infection. The lowest  concentration of SARS-CoV-2 viral copies this assay can detect is 250  copies / mL. A negative result does not preclude SARS-CoV-2 infection  and should not be used as the sole basis for treatment or other  patient management decisions.  A negative result may occur with  improper specimen collection /  handling, submission of specimen other  than nasopharyngeal swab, presence of viral mutation(s) within the  areas targeted by this assay, and inadequate number of viral copies  (<250 copies / mL). A negative result must be combined with clinical  observations, patient history, and epidemiological information. If result is POSITIVE SARS-CoV-2 target nucleic acids are DETECTED. The SARS-CoV-2 RNA is generally detectable in upper and lower  respiratory specimens dur ing the acute phase of infection.  Positive  results are indicative of active infection with SARS-CoV-2.  Clinical  correlation with patient history and other diagnostic information is  necessary to determine patient infection status.  Positive results do  not rule out bacterial infection or co-infection with other viruses. If result is PRESUMPTIVE POSTIVE SARS-CoV-2 nucleic acids MAY BE PRESENT.   A presumptive positive result was obtained on the submitted specimen  and confirmed on repeat testing.  While 2019 novel coronavirus  (SARS-CoV-2) nucleic acids may be present in the submitted sample  additional confirmatory testing may be necessary for epidemiological  and / or clinical management purposes  to differentiate between  SARS-CoV-2 and other Sarbecovirus currently known to infect humans.  If clinically indicated additional testing with an alternate test  methodology [id number]) is  advised. The SARS-CoV-2 RNA is generally  detectable in upper and lower respiratory sp ecimens during the acute  phase of infection. The expected result is Negative. Fact Sheet for Patients:  StrictlyIdeas.no Fact Sheet for Healthcare Providers: BankingDealers.co.za This test is not yet approved or cleared by the [place] FDA and has been authorized for detection and/or diagnosis of SARS-CoV-2 by FDA under an Emergency Use Authorization (EUA).  This EUA will remain in effect (meaning this  test can be used) for the duration of the COVID-19 declaration under Section 564(b)(1) of the Act, 21 U.S.C. section 360bbb-3(b)(1), unless the authorization is terminated or revoked sooner. Performed at [facility], [address] [place], [place] [address]   MRSA PCR Screening     Status: None   Collection Time: 11/08/18  7:39 PM  Result Value Ref Range Status   MRSA by PCR NEGATIVE NEGATIVE Final    Comment:        The GeneXpert MRSA Assay (FDA approved for NASAL specimens only), is one component of a comprehensive MRSA colonization surveillance program. It is not intended to diagnose MRSA infection nor to guide or monitor treatment for MRSA infections. Performed at [facility], [address] [place], [place] [address]           IMAGING    Dg Chest Port 1 View  Result Date: 11/11/2018 CLINICAL DATA:  Acute respiratory failure EXAM: PORTABLE CHEST 1 VIEW COMPARISON:  11/09/2018 FINDINGS: The endotracheal tube terminates above the carina. The enteric tube extends below the left hemidiaphragm. There is a developing right lower lobe airspace opacity and pleural effusion. There is no pneumothorax. There is volume overload. The cardiac size is stable. There is a small left-sided pleural effusion. IMPRESSION: 1. Worsening right lower lobe opacity may represent a combination of an infiltrate and small right-sided pleural effusion. 2. Endotracheal tube terminates above the carina. 3. Persistent interstitial pulmonary edema. Electronically Signed   By: Constance Holster M.D.   On: 11/11/2018 20:42    CBC    Component Value Date/Time   WBC 9.2 11/12/2018 0346   RBC 4.32 11/12/2018 0346   HGB 13.1 11/12/2018 0346   HGB 12.9 08/30/2012 0617   HCT 42.2 11/12/2018 0346   HCT 37.5 08/30/2012 0617   PLT 208 11/12/2018 0346   PLT 212 08/30/2012 0617   MCV 97.7 11/12/2018 0346   MCV 93 08/30/2012 0617   MCH 30.3 11/12/2018 0346   MCHC 31.0 11/12/2018 0346    RDW 17.1 (H) 11/12/2018 0346   RDW 13.6 08/30/2012 0617   LYMPHSABS 0.6 (L) 11/12/2018 0346   MONOABS 0.6 11/12/2018 0346   EOSABS 0.0 11/12/2018 0346   BASOSABS 0.0 11/12/2018 0346   BMP Latest Ref Rng & Units 11/12/2018 11/11/2018 11/10/2018  Glucose 70 - 99 mg/dL 131(H) 147(H) 140(H)  BUN 8 - 23 mg/dL 30(H) 24(H) 21  Creatinine 0.44 - 1.00 mg/dL 0.93 1.24(H) 1.47(H)  Sodium 135 - 145 mmol/L 140 137 136  Potassium 3.5 - 5.1 mmol/L 4.6 4.5 4.8  Chloride 98 - 111 mmol/L 100 98 96(L)  CO2 22 - 32 mmol/L 32 30 30  Calcium 8.9 - 10.3 mg/dL 8.2(L) 7.7(L) 7.2(L)      Indwelling Urinary Catheter continued, requirement due to   Reason to continue Indwelling Urinary Catheter strict Intake/Output monitoring for hemodynamic instability         Ventilator continued, requirement due to severe respiratory failure   Ventilator Sedation RASS 0 to -2  ASSESSMENT AND PLAN SYNOPSIS  Admitted for acute severe hypoxic resp failure from ETOH abuse and Narcotic OD with acute and severe COPD exacerbation aspiration pneumonia associated with cannibus use and active smoking   Severe ACUTE Hypoxic and Hypercapnic Respiratory Failure -continue Full MV support -continue Bronchodilator Therapy -Wean Fio2 and PEEP as tolerated -will perform SAT/SBT when respiratory parameters are met   SEVERE COPD EXACERBATION -continue IV steroids as prescribed -continue NEB THERAPY as prescribed -morphine as needed -wean fio2 as needed and tolerated    NEUROLOGY - intubated and sedated - minimal sedation to achieve a RASS goal: -1 Wake up assessment pending    CARDIAC ICU monitoring  ID -continue IV abx as prescibed -follow up cultures  GI GI PROPHYLAXIS as indicated  NUTRITIONAL STATUS DIET-->TF's as tolerated Constipation protocol as indicated  ENDO - will use ICU hypoglycemic\Hyperglycemia protocol if indicated   ELECTROLYTES -follow labs as needed -replace as needed -pharmacy  consultation and following   DVT/GI PRX ordered TRANSFUSIONS AS NEEDED MONITOR FSBS ASSESS the need for LABS as needed   Critical Care Time devoted to patient care services described in this note is 34 minutes.   Overall, patient is critically ill, prognosis is guarded.  Patient with Multiorgan failure and at high risk for cardiac arrest and death.    Corrin Parker, M.D.  [facility]  Medical Director [place] Director [facility] Cardio-Pulmonary Department

## 2018-11-13 ENCOUNTER — Inpatient Hospital Stay: Payer: Medicare HMO

## 2018-11-13 DIAGNOSIS — F1010 Alcohol abuse, uncomplicated: Secondary | ICD-10-CM

## 2018-11-13 LAB — COMPREHENSIVE METABOLIC PANEL
ALT: 300 U/L — ABNORMAL HIGH (ref 0–44)
AST: 50 U/L — ABNORMAL HIGH (ref 15–41)
Albumin: 3.1 g/dL — ABNORMAL LOW (ref 3.5–5.0)
Alkaline Phosphatase: 51 U/L (ref 38–126)
Anion gap: 10 (ref 5–15)
BUN: 31 mg/dL — ABNORMAL HIGH (ref 8–23)
CO2: 39 mmol/L — ABNORMAL HIGH (ref 22–32)
Calcium: 9.1 mg/dL (ref 8.9–10.3)
Chloride: 94 mmol/L — ABNORMAL LOW (ref 98–111)
Creatinine, Ser: 0.69 mg/dL (ref 0.44–1.00)
GFR calc Af Amer: >60 mL/min (ref >60)
GFR calc non Af Amer: >60 mL/min (ref >60)
Glucose, Bld: 102 mg/dL — ABNORMAL HIGH (ref 70–99)
Potassium: 4.8 mmol/L (ref 3.5–5.1)
Sodium: 143 mmol/L (ref 135–145)
Total Bilirubin: 1.6 mg/dL — ABNORMAL HIGH (ref 0.3–1.2)
Total Protein: 6.4 g/dL — ABNORMAL LOW (ref 6.5–8.1)

## 2018-11-13 LAB — CBC WITH DIFFERENTIAL/PLATELET
Abs Immature Granulocytes: 0.08 10*3/uL — ABNORMAL HIGH (ref 0.00–0.07)
Basophils Absolute: 0 10*3/uL (ref 0.0–0.1)
Basophils Relative: 0 %
Eosinophils Absolute: 0 10*3/uL (ref 0.0–0.5)
Eosinophils Relative: 0 %
HCT: 44.6 % (ref 36.0–46.0)
Hemoglobin: 13.5 g/dL (ref 12.0–15.0)
Immature Granulocytes: 1 %
Lymphocytes Relative: 4 %
Lymphs Abs: 0.4 10*3/uL — ABNORMAL LOW (ref 0.7–4.0)
MCH: 30.5 pg (ref 26.0–34.0)
MCHC: 30.3 g/dL (ref 30.0–36.0)
MCV: 100.9 fL — ABNORMAL HIGH (ref 80.0–100.0)
Monocytes Absolute: 0.7 10*3/uL (ref 0.1–1.0)
Monocytes Relative: 7 %
Neutro Abs: 8.5 10*3/uL — ABNORMAL HIGH (ref 1.7–7.7)
Neutrophils Relative %: 88 %
Platelets: 202 10*3/uL (ref 150–400)
RBC: 4.42 MIL/uL (ref 3.87–5.11)
RDW: 17 % — ABNORMAL HIGH (ref 11.5–15.5)
WBC: 9.8 10*3/uL (ref 4.0–10.5)
nRBC: 0 % (ref 0.0–0.2)

## 2018-11-13 LAB — CULTURE, BLOOD (ROUTINE X 2)
Culture: NO GROWTH
Special Requests: ADEQUATE

## 2018-11-13 LAB — POTASSIUM: Potassium: 4.9 mmol/L (ref 3.5–5.1)

## 2018-11-13 LAB — BLOOD GAS, ARTERIAL
Acid-Base Excess: 16.6 mmol/L — ABNORMAL HIGH (ref 0.0–2.0)
Bicarbonate: 45 mmol/L — ABNORMAL HIGH (ref 20.0–28.0)
Delivery systems: POSITIVE
Expiratory PAP: 6
FIO2: 60
Inspiratory PAP: 12
Mechanical Rate: 10
O2 Saturation: 97.1 %
Patient temperature: 37
pCO2 arterial: 71 mmHg (ref 32.0–48.0)
pH, Arterial: 7.41 (ref 7.350–7.450)
pO2, Arterial: 91 mmHg (ref 83.0–108.0)

## 2018-11-13 LAB — GLUCOSE, CAPILLARY
Glucose-Capillary: 101 mg/dL — ABNORMAL HIGH (ref 70–99)
Glucose-Capillary: 103 mg/dL — ABNORMAL HIGH (ref 70–99)
Glucose-Capillary: 79 mg/dL (ref 70–99)
Glucose-Capillary: 83 mg/dL (ref 70–99)
Glucose-Capillary: 90 mg/dL (ref 70–99)
Glucose-Capillary: 94 mg/dL (ref 70–99)
Glucose-Capillary: 97 mg/dL (ref 70–99)

## 2018-11-13 LAB — BASIC METABOLIC PANEL
Anion gap: 9 (ref 5–15)
BUN: 33 mg/dL — ABNORMAL HIGH (ref 8–23)
CO2: 34 mmol/L — ABNORMAL HIGH (ref 22–32)
Calcium: 8.8 mg/dL — ABNORMAL LOW (ref 8.9–10.3)
Chloride: 102 mmol/L (ref 98–111)
Creatinine, Ser: 0.68 mg/dL (ref 0.44–1.00)
GFR calc Af Amer: >60 mL/min (ref >60)
GFR calc non Af Amer: >60 mL/min (ref >60)
Glucose, Bld: 107 mg/dL — ABNORMAL HIGH (ref 70–99)
Potassium: 5.3 mmol/L — ABNORMAL HIGH (ref 3.5–5.1)
Sodium: 145 mmol/L (ref 135–145)

## 2018-11-13 LAB — AMMONIA: Ammonia: <9 umol/L — ABNORMAL LOW (ref 9–35)

## 2018-11-13 MED ORDER — SODIUM CHLORIDE 0.9 % IV SOLN
INTRAVENOUS | Status: DC | PRN
  Administered 2018-11-13: 500 mL via INTRAVENOUS
  Administered 2018-11-16 (×2): 250 mL via INTRAVENOUS

## 2018-11-13 MED ORDER — THIAMINE HCL 100 MG/ML IJ SOLN
100.0000 mg | Freq: Every day | INTRAMUSCULAR | Status: DC
  Administered 2018-11-13 – 2018-11-14 (×2): 100 mg via INTRAVENOUS
  Filled 2018-11-13 (×2): qty 2

## 2018-11-13 MED ORDER — ADULT MULTIVITAMIN W/MINERALS CH
1.0000 | ORAL_TABLET | Freq: Every day | ORAL | Status: DC
  Administered 2018-11-15 – 2018-11-17 (×3): 1 via ORAL
  Filled 2018-11-13 (×4): qty 1

## 2018-11-13 MED ORDER — FOLIC ACID 5 MG/ML IJ SOLN
1.0000 mg | Freq: Every day | INTRAMUSCULAR | Status: DC
  Administered 2018-11-13 – 2018-11-14 (×2): 1 mg via INTRAVENOUS
  Filled 2018-11-13 (×3): qty 0.2

## 2018-11-13 MED ORDER — LORAZEPAM 2 MG/ML IJ SOLN
2.0000 mg | INTRAMUSCULAR | Status: DC | PRN
  Administered 2018-11-13: 2 mg via INTRAVENOUS
  Filled 2018-11-13: qty 1

## 2018-11-13 MED ORDER — LORAZEPAM 2 MG/ML IJ SOLN
1.0000 mg | INTRAMUSCULAR | Status: DC | PRN
  Administered 2018-11-13: 2 mg via INTRAVENOUS
  Administered 2018-11-15: 1 mg via INTRAVENOUS
  Filled 2018-11-13 (×2): qty 1

## 2018-11-13 MED ORDER — FUROSEMIDE 10 MG/ML IJ SOLN
40.0000 mg | Freq: Once | INTRAMUSCULAR | Status: AC
  Administered 2018-11-13: 40 mg via INTRAVENOUS
  Filled 2018-11-13: qty 4

## 2018-11-13 NOTE — Progress Notes (Signed)
[employer] - [place] at [facility]   PATIENT NAME: Angela Berry    MR#:  [id number]  DATE OF BIRTH:  05/15/1951  SUBJECTIVE:  CHIEF COMPLAINT:   Chief Complaint  Patient presents with  . Unresponsive   Patient was extubated on 11/12/2018.  Currently on BiPAP and resting comfortably in the ICU.  REVIEW OF SYSTEMS:  Review of Systems  Constitutional: Negative for chills and fever.  HENT: Negative for hearing loss and tinnitus.   Eyes: Negative for blurred vision and double vision.  Respiratory: Negative for cough and shortness of breath.   Cardiovascular: Negative for chest pain and palpitations.  Gastrointestinal: Negative for heartburn, nausea and vomiting.  Genitourinary: Negative for dysuria and urgency.  Musculoskeletal: Negative for back pain and neck pain.  Skin: Negative for itching and rash.  Neurological: Negative for dizziness and headaches.  Psychiatric/Behavioral: Negative for depression and hallucinations.     DRUG ALLERGIES:  No Known Allergies VITALS:  Blood pressure (!) 152/82, pulse (!) 128, temperature 98.5 F (36.9 C), temperature source Axillary, resp. rate 20, height 5' 2.52" (1.588 m), weight 77.9 kg, SpO2 94 %. PHYSICAL EXAMINATION:  Physical Exam   GENERAL:  68 y.o.-year-old patient lying in the bed.on BiPAP.  Not in any distress. EYES: Pupils equal, round, reactive to light and accommodation. No scleral icterus. Extraocular muscles intact.  HEENT: Head atraumatic, normocephalic. Oropharynx and nasopharynx clear.  NECK:  Supple, no jugular venous distention. No thyroid enlargement, no tenderness.  LUNGS: Normal breath sounds bilaterally CARDIOVASCULAR: S1, S2 normal. No murmurs, rubs, or gallops.  ABDOMEN: Soft, nontender, nondistended. Bowel sounds present. No organomegaly or mass.  NEURO: Awake and alert and oriented.  Extubated yesterday.  Moving all extremities. EXTREMITIES: No cyanosis, clubbing or edema b/l.      PSYCHIATRIC: Patient is awake and alert.  LABORATORY PANEL:  Female CBC Recent Labs  Lab 11/13/18 0431  WBC 9.8  HGB 13.5  HCT 44.6  PLT 202   ------------------------------------------------------------------------------------------------------------------ Chemistries  Recent Labs  Lab 11/11/18 1420 11/12/18 0346 11/13/18 0431  NA  --  140 145  K  --  4.6 5.3*  CL  --  100 102  CO2  --  32 34*  GLUCOSE  --  131* 107*  BUN  --  30* 33*  CREATININE  --  0.93 0.68  CALCIUM  --  8.2* 8.8*  MG  --  2.9*  --   AST 155*  --   --   ALT 662*  --   --   ALKPHOS 62  --   --   BILITOT 0.6  --   --    RADIOLOGY:  Dg Chest Port 1 View  Result Date: 11/13/2018 CLINICAL DATA:  Acute respiratory failure. EXAM: PORTABLE CHEST 1 VIEW COMPARISON:  11/11/2018 and 08/30/2012 FINDINGS: Endotracheal tube has been removed in the interim. Again noted are prominent interstitial lung markings. No focal airspace disease or consolidation. Improved aeration at the right lung base but persistent blunting at the right costophrenic angle. Heart size is within normal limits. The trachea is midline. Negative for a pneumothorax. Deformity in the proximal right humerus is compatible with an old injury. IMPRESSION: Persistent prominent interstitial lung markings. Findings could represent a combination of chronic changes and interstitial edema. Interstitial densities are similar to the recent comparison examination. Blunting at the right costophrenic angle may represent a tiny right pleural effusion and/or atelectasis. Electronically Signed   By: Richarda Overlie M.D.   On:  11/13/2018 11:13   ASSESSMENT AND PLAN:   Patient is a 68 year old female with history of asthma and COPD being admitted to the ICU for further management of acute hypercapnic respiratory failure  *Acute hypercapnic respiratory failure Patient with known history of COPD and asthma. Patient had to be intubated in the emergency room due to  evidence of hypercapnic respiratory failure . Being managed by intensive care team.  Patient extubated on 11/12/2018. On BiPAP this morning. Patient said to be high risk for reintubation.  Has history of alcohol abuse/possible DTs  *Mild hyperkalemia  with potassium of 5.3. Follow-up on repeat potassium level in a.m.  *Septic shock Likely due to aspiration pneumonia; On Unasyn Patient was hypotensive and hypothermic. Patient also required Levophed drip Blood cultures with coag negative staph which is likely contaminant   *Acute metabolic encephalopathy CT scan of the head normal.   Extubated on 11/12/2018..  Improving clinically  *Acute kidney injury Resolved with IV fluids.   * Shock liver LFTs trending down Follow AST, ALT and INR  *Coagulopathy. INR was 5.8. Down to 1.5   Due to shock liver  DVT proph Was on SCDs due to recent coagulopathy, which has been previously resolved Initiate Lovenox  All the records are reviewed and case discussed with Care Management/Social Worker. Management plans discussed with the patient, family and they are in agreement.  CODE STATUS: Full Code  TOTAL TIME TAKING CARE OF THIS PATIENT: 37 minutes.   More than 50% of the time was spent in counseling/coordination of care: YES  POSSIBLE D/C IN 2 DAYS, DEPENDING ON CLINICAL CONDITION.   Wafaa Deemer M.D on 11/13/2018 at 2:08 PM  Between 7am to 6pm - Pager - [phone]  After 6pm go to [email or web address] - Social research officer, governmentpassword EPAS [facility]  [facility] [place] [facility]  Office  [phone]  CC: Primary care physician; Martie RoundSpencer, Nicole, NP  Note: This dictation was prepared with Dragon dictation along with smaller phrase technology. Any transcriptional errors that result from this process are unintentional.

## 2018-11-13 NOTE — Progress Notes (Signed)
Updated patient's family at this time.

## 2018-11-13 NOTE — Consult Note (Signed)
PHARMACY CONSULT NOTE - FOLLOW UP  Pharmacy Consult for Electrolyte Monitoring and Replacement   Recent Labs: Potassium (mmol/L)  Date Value  11/13/2018 4.9  08/30/2012 3.8   Magnesium (mg/dL)  Date Value  56/97/9480 2.9 (H)   Calcium (mg/dL)  Date Value  16/55/3748 8.8 (L)   Calcium, Total (mg/dL)  Date Value  27/12/8673 8.9   Albumin (g/dL)  Date Value  44/92/0100 2.9 (L)  08/30/2012 3.4   Phosphorus (mg/dL)  Date Value  71/21/9758 4.5   Sodium (mmol/L)  Date Value  11/13/2018 145  08/30/2012 132 (L)    Assessment: 68 y.o. female with a history of COPD and asthma who was brought into the ED by EMS with report of passing out after drinking some wine the night before. She has noted electrolyte abnormalities  Goal of Therapy:  K ~ 4, magnesium ~2, phosphorous 2.5-4.6, calcium 8-10  Plan:  --No further electrolyte supplementation required for today --magnesium expected to self-resolve: f/u levels in am --F/U labs in the am and replace as needed  Kayshawn Ozburn L ,PharmD Clinical Pharmacist 11/13/2018 5:53 PM

## 2018-11-13 NOTE — Progress Notes (Signed)
CRITICAL CARE NOTE  CC  follow up respiratory failure  SUBJECTIVE Patient remains critically ill Prognosis is guarded On biPAP Severe wheezing Severe COPD exacerbation Extubated yesterday but very high risk for reintubation Patient was given Ativan last night for  increased agitation Patient with history of severe alcohol abuse possible DTs    BP 118/90   Pulse (!) 127   Temp 98.5 F (36.9 C) (Axillary)   Resp (!) 21   Ht 5' 2.52" (1.588 m)   Wt 77.9 kg   SpO2 96%   BMI 30.89 kg/m    I/O last 3 completed shifts: In: 888.1 [I.V.:288.1; IV Piggyback:600] Out: 1845 [Urine:1845] No intake/output data recorded.  SpO2: 96 % O2 Flow Rate (L/min): 4 L/min FiO2 (%): 60 %   SIGNIFICANT EVENTS 5/31 Admitted for severe COPD exacerbation severe respiratory failure 6/1-6/3 on vent critically ill severe respiratory distress, held weaning trials 6/3 extubated 6/4 severe respiratory distress from severe COPD exacerbation placed on BiPAP  REVIEW OF SYSTEMS  PATIENT IS UNABLE TO PROVIDE COMPLETE REVIEW OF SYSTEMS DUE TO SEVERE CRITICAL ILLNESS   PHYSICAL EXAMINATION:  GENERAL:critically ill appearing, +resp distress HEAD: Normocephalic, atraumatic.  EYES: Pupils equal, round, reactive to light.  No scleral icterus.  MOUTH: Moist mucosal membrane. NECK: Supple. No thyromegaly. No nodules. No JVD.  PULMONARY: +rhonchi, +wheezing CARDIOVASCULAR: S1 and S2. Regular rate and rhythm. No murmurs, rubs, or gallops.  GASTROINTESTINAL: Soft, nontender, -distended. No masses. Positive bowel sounds. No hepatosplenomegaly.  MUSCULOSKELETAL: No swelling, clubbing, or edema.  NEUROLOGIC: obtunded,  SKIN:intact,warm,dry  MEDICATIONS: I have reviewed all medications and confirmed regimen as documented   CULTURE RESULTS   Recent Results (from the past 240 hour(s))  Blood culture (routine x 2)     Status: None   Collection Time: 11/08/18 11:38 AM  Result Value Ref Range Status    Specimen Description BLOOD RIGHT ANTECUBITAL  Final   Special Requests   Final    BOTTLES DRAWN AEROBIC AND ANAEROBIC Blood Culture adequate volume   Culture   Final    NO GROWTH 5 DAYS Performed at [facility], [address], [place], [place] [address]    Report Status 11/13/2018 FINAL  Final  Blood culture (routine x 2)     Status: Abnormal   Collection Time: 11/08/18 11:38 AM  Result Value Ref Range Status   Specimen Description   Final    BLOOD LEFT ANTECUBITAL Performed at [facility], [address], [place], [place] [address]    Special Requests   Final    BOTTLES DRAWN AEROBIC AND ANAEROBIC Blood Culture adequate volume Performed at [facility], [address] [place], [place] [address]    Culture  Setup Time   Final    IN BOTH AEROBIC AND ANAEROBIC BOTTLES GRAM POSITIVE COCCI CRITICAL RESULT CALLED TO, READ BACK BY AND VERIFIED WITH: DAVID BESANTI AT 0326 ON 11/09/2018 JJB    Culture (A)  Final    STAPHYLOCOCCUS SPECIES (COAGULASE NEGATIVE) THE SIGNIFICANCE OF ISOLATING THIS ORGANISM FROM A SINGLE SET OF BLOOD CULTURES WHEN MULTIPLE SETS ARE DRAWN IS UNCERTAIN. PLEASE NOTIFY THE MICROBIOLOGY DEPARTMENT WITHIN ONE WEEK IF SPECIATION AND SENSITIVITIES ARE REQUIRED. Performed at [facility], [address], [place], [place] [address]    Report Status 11/11/2018 FINAL  Final  Blood Culture ID Panel (Reflexed)     Status: Abnormal   Collection Time: 11/08/18 11:38 AM  Result Value Ref Range Status   Enterococcus species NOT  DETECTED NOT DETECTED Final   Listeria monocytogenes NOT DETECTED NOT DETECTED Final   Staphylococcus species DETECTED (A) NOT DETECTED Final    Comment: Methicillin (oxacillin) susceptible coagulase negative staphylococcus. Possible blood culture contaminant (unless isolated from more than one blood culture draw or clinical case suggests pathogenicity). No antibiotic treatment is indicated for blood  culture  contaminants. CRITICAL RESULT CALLED TO, READ BACK BY AND VERIFIED WITH: DAVID BESANTI AT 0326 ON 11/09/2018 JJB    Staphylococcus aureus (BCID) NOT DETECTED NOT DETECTED Final   Methicillin resistance NOT DETECTED NOT DETECTED Final   Streptococcus species NOT DETECTED NOT DETECTED Final   Streptococcus agalactiae NOT DETECTED NOT DETECTED Final   Streptococcus pneumoniae NOT DETECTED NOT DETECTED Final   Streptococcus pyogenes NOT DETECTED NOT DETECTED Final   Acinetobacter baumannii NOT DETECTED NOT DETECTED Final   Enterobacteriaceae species NOT DETECTED NOT DETECTED Final   Enterobacter cloacae complex NOT DETECTED NOT DETECTED Final   Escherichia coli NOT DETECTED NOT DETECTED Final   Klebsiella oxytoca NOT DETECTED NOT DETECTED Final   Klebsiella pneumoniae NOT DETECTED NOT DETECTED Final   Proteus species NOT DETECTED NOT DETECTED Final   Serratia marcescens NOT DETECTED NOT DETECTED Final   Haemophilus influenzae NOT DETECTED NOT DETECTED Final   Neisseria meningitidis NOT DETECTED NOT DETECTED Final   Pseudomonas aeruginosa NOT DETECTED NOT DETECTED Final   Candida albicans NOT DETECTED NOT DETECTED Final   Candida glabrata NOT DETECTED NOT DETECTED Final   Candida krusei NOT DETECTED NOT DETECTED Final   Candida parapsilosis NOT DETECTED NOT DETECTED Final   Candida tropicalis NOT DETECTED NOT DETECTED Final    Comment: Performed at [facility], [address] [place], [place] [address]  SARS Coronavirus 2 (CEPHEID - Performed in [facility] hospital lab), Hosp Order     Status: None   Collection Time: 11/08/18 12:22 PM  Result Value Ref Range Status   SARS Coronavirus 2 NEGATIVE NEGATIVE Final    Comment: (NOTE) If result is NEGATIVE SARS-CoV-2 target nucleic acids are NOT DETECTED. The SARS-CoV-2 RNA is generally detectable in upper and lower  respiratory specimens during the acute phase of infection. The lowest  concentration of SARS-CoV-2 viral copies this  assay can detect is 250  copies / mL. A negative result does not preclude SARS-CoV-2 infection  and should not be used as the sole basis for treatment or other  patient management decisions.  A negative result may occur with  improper specimen collection / handling, submission of specimen other  than nasopharyngeal swab, presence of viral mutation(s) within the  areas targeted by this assay, and inadequate number of viral copies  (<250 copies / mL). A negative result must be combined with clinical  observations, patient history, and epidemiological information. If result is POSITIVE SARS-CoV-2 target nucleic acids are DETECTED. The SARS-CoV-2 RNA is generally detectable in upper and lower  respiratory specimens dur ing the acute phase of infection.  Positive  results are indicative of active infection with SARS-CoV-2.  Clinical  correlation with patient history and other diagnostic information is  necessary to determine patient infection status.  Positive results do  not rule out bacterial infection or co-infection with other viruses. If result is PRESUMPTIVE POSTIVE SARS-CoV-2 nucleic acids MAY BE PRESENT.   A presumptive positive result was obtained on the submitted specimen  and confirmed on repeat testing.  While 2019 novel coronavirus  (SARS-CoV-2) nucleic acids may be present in the submitted sample  additional confirmatory testing may  be necessary for epidemiological  and / or clinical management purposes  to differentiate between  SARS-CoV-2 and other Sarbecovirus currently known to infect humans.  If clinically indicated additional testing with an alternate test  methodology [phone]) is advised. The SARS-CoV-2 RNA is generally  detectable in upper and lower respiratory sp ecimens during the acute  phase of infection. The expected result is Negative. Fact Sheet for Patients:  BoilerBrush.com.cy Fact Sheet for Healthcare  Providers: https://pope.com/ This test is not yet approved or cleared by the [place] FDA and has been authorized for detection and/or diagnosis of SARS-CoV-2 by FDA under an Emergency Use Authorization (EUA).  This EUA will remain in effect (meaning this test can be used) for the duration of the COVID-19 declaration under Section 564(b)(1) of the Act, 21 U.S.C. section 360bbb-3(b)(1), unless the authorization is terminated or revoked sooner. Performed at [facility], [address] [place], [place] [address]   MRSA PCR Screening     Status: None   Collection Time: 11/08/18  7:39 PM  Result Value Ref Range Status   MRSA by PCR NEGATIVE NEGATIVE Final    Comment:        The GeneXpert MRSA Assay (FDA approved for NASAL specimens only), is one component of a comprehensive MRSA colonization surveillance program. It is not intended to diagnose MRSA infection nor to guide or monitor treatment for MRSA infections. Performed at [facility], [address] [place], [place] [address]    CBC    Component Value Date/Time   WBC 9.8 11/13/2018 0431   RBC 4.42 11/13/2018 0431   HGB 13.5 11/13/2018 0431   HGB 12.9 08/30/2012 0617   HCT 44.6 11/13/2018 0431   HCT 37.5 08/30/2012 0617   PLT 202 11/13/2018 0431   PLT 212 08/30/2012 0617   MCV 100.9 (H) 11/13/2018 0431   MCV 93 08/30/2012 0617   MCH 30.5 11/13/2018 0431   MCHC 30.3 11/13/2018 0431   RDW 17.0 (H) 11/13/2018 0431   RDW 13.6 08/30/2012 0617   LYMPHSABS 0.4 (L) 11/13/2018 0431   MONOABS 0.7 11/13/2018 0431   EOSABS 0.0 11/13/2018 0431   BASOSABS 0.0 11/13/2018 0431   BMP Latest Ref Rng & Units 11/13/2018 11/12/2018 11/11/2018  Glucose 70 - 99 mg/dL 478(G) 956(O) 130(Q)  BUN 8 - 23 mg/dL 65(H) 84(O) 96(E)  Creatinine 0.44 - 1.00 mg/dL 9.52 8.41 3.24(M)  Sodium 135 - 145 mmol/L 145 140 137  Potassium 3.5 - 5.1 mmol/L 5.3(H) 4.6 4.5  Chloride 98 - 111 mmol/L 102 100 98   CO2 22 - 32 mmol/L 34(H) 32 30  Calcium 8.9 - 10.3 mg/dL 0.1(U) 2.7(O) 7.7(L)        .CBC    Indwelling Urinary Catheter continued, requirement due to   Reason to continue Indwelling Urinary Catheter strict Intake/Output monitoring for hemodynamic instability   Central Line/ continued, requirement due to  Reason to continue [employer] Monitoring of central venous pressure or other hemodynamic parameters and poor IV access        ASSESSMENT AND PLAN SYNOPSIS  68 year old white female with extensive alcohol abuse with severe COPD exacerbation with acute hypoxic and hypercapnic respiratory failure with high risk for DTs Patient had been on ventilator now extubated on BiPAP High risk for reintubation  Severe ACUTE Hypoxic and Hypercapnic Respiratory Failure Continue BiPAP as needed -continue Bronchodilator Therapy  SEVERE COPD EXACERBATION -continue IV steroids as prescribed -continue NEB THERAPY as prescribed -morphine as needed -wean fio2 as  needed and tolerated    NEUROLOGY Severe agitation Possible DTs Start CIWA protocol   CARDIAC ICU monitoring  ID -continue IV abx as prescibed -follow up cultures  GI GI PROPHYLAXIS as indicated  NUTRITIONAL STATUS DIET--> n.p.o. Constipation protocol as indicated  ENDO - will use ICU hypoglycemic\Hyperglycemia protocol if indicated   ELECTROLYTES -follow labs as needed -replace as needed -pharmacy consultation and following   DVT/GI PRX ordered TRANSFUSIONS AS NEEDED MONITOR FSBS ASSESS the need for LABS as needed   Critical Care Time devoted to patient care services described in this note is 35 minutes.   Overall, patient is critically ill, prognosis is guarded.   high risk for cardiac arrest and death.    Lucie LeatherKurian David Jeliyah Middlebrooks, M.D.  [facility]  Medical Director [facility] Medical Director [facility] Cardio-Pulmonary Department

## 2018-11-13 NOTE — Progress Notes (Signed)
Pharmacy Antibiotic Note  Angela Berry is a 68 y.o. female admitted on 11/08/2018. Patient admitted via EMS after being intoxicated and passing out. Patient intubated in ED for acute hypercapnic respiratory failure on 5/31. Patient initially started on vancomycin and cefepime. MRSA PCR is negative. Pharmacy was consulted for Unasyn dosing. Leukocytosis is slightly improved today and she has been afebrile since the previous note. SCr was trending up but is lower today, although not yet back to baseline: will continue to monitor. This is day number 4 of antibiotics and day number 3 of Unasyn.  Plan: continue Unasyn 3g IV Q6hr to cover possible CAP and aspiration event.   Height: 5' 2.52" (158.8 cm) Weight: 171 lb 11.8 oz (77.9 kg) IBW/kg (Calculated) : 51.3  Temp (24hrs), Avg:98.3 F (36.8 C), Min:97.1 F (36.2 C), Max:98.7 F (37.1 C)  Recent Labs  Lab 11/08/18 1138  11/08/18 1355  11/09/18 0506 11/09/18 0646 11/10/18 0359 11/11/18 0420 11/12/18 0346 11/13/18 0431  WBC  --   --   --   --  15.9*  --  17.8* 15.5* 9.2 9.8  CREATININE  --    < >  --    < >  --  1.21* 1.47* 1.24* 0.93 0.68  LATICACIDVEN 2.4*  --  2.1*  --   --   --  1.4  --   --   --    < > = values in this interval not displayed.    Estimated Creatinine Clearance: 68.5 mL/min (by C-G formula based on SCr of 0.68 mg/dL).    Antimicrobials this admission: Cefepime 5/31 >> 6/1  Vancomycin 5/31 >> 5/31  Unasyn 6/1 >>   Dose adjustments this admission: N/A  Microbiology results: 5/31 BCx: MSSS 2/4 5/31 MRSA PCR: negative  5/31 COVID: negative   Thank you for allowing pharmacy to be a part of this patient's care.  Xaviera Flaten L, PharmD 11/13/2018 5:53 PM

## 2018-11-13 NOTE — Progress Notes (Signed)
Nutrition Follow Up Note   DOCUMENTATION CODES:   Not applicable  INTERVENTION:   RD will order supplements once diet able to be advanced   NUTRITION DIAGNOSIS:   Inadequate oral intake related to inability to eat as evidenced by NPO status. -continues  GOAL:   Patient will meet greater than or equal to 90% of their needs  MONITOR:   Diet advancement, Labs, Weight trends, I & O's, Skin  ASSESSMENT:   68 year old female with PMHx of COPD, asthma admitted with acute hypercapnic respiratory failure requiring intubation on 5/31, hyperkalemia, septic shock, acute metabolic encephalopathy, AKI.   Pt extubated on 6/4; tolerating well. Pt on bipap, has been unable to initiate on diet. RD will add supplements and vitamins once diet advanced. SLP following. Pt with weight gain over the past week; RD will continue to monitor.   Medications reviewed and include: folic acid, solu-medrol, MVI, protonix, thiamine, unasyn   Labs reviewed: K 4.9 wnl, BUN 33(H)  NUTRITION - FOCUSED PHYSICAL EXAM:    Most Recent Value  Orbital Region  No depletion  Upper Arm Region  No depletion  Thoracic and Lumbar Region  No depletion  Buccal Region  Unable to assess  Temple Region  No depletion  Clavicle Bone Region  No depletion  Clavicle and Acromion Bone Region  No depletion  Scapular Bone Region  Unable to assess  Dorsal Hand  No depletion  Patellar Region  No depletion  Anterior Thigh Region  No depletion  Posterior Calf Region  No depletion  Edema (RD Assessment)  None  Hair  Reviewed  Eyes  Unable to assess  Mouth  Unable to assess  Skin  Reviewed  Nails  Reviewed     Diet Order:   Diet Order            Diet NPO time specified  Diet effective now             EDUCATION NEEDS:   No education needs have been identified at this time  Skin:  Skin Assessment: Reviewed RN Assessment  Last BM:  Unknown/PTA  Height:   Ht Readings from Last 1 Encounters:  11/09/18 5' 2.52"  (1.588 m)   Weight:   Wt Readings from Last 1 Encounters:  11/13/18 77.9 kg   Ideal Body Weight:  51.2 kg  BMI:  Body mass index is 30.89 kg/m.  Estimated Nutritional Needs:   Kcal:  1600-1800kcal/day   Protein:  80-90g/day   Fluid:  >1.5L/day   Betsey Holiday MS, RD, LDN Pager #- [phone] Office#- [phone] After Hours Pager: [phone]

## 2018-11-13 NOTE — TOC Initial Note (Signed)
Transition of Care [facility]) - Initial/Assessment Note    Patient Details  Name: Angela Berry MRN: [id number] Date of Birth: 1950/10/05  Transition of Care [facility]) CM/SW Contact:    Allayne Butcher, RN Phone Number: 11/13/2018, 2:16 PM  Clinical Narrative:                 Patient admitted with respiratory failure requiring intubation in the ED on 5/31, per ED note patient was found by family unresponsive at home.  Patient has a history of severe COPD.  Patient was extubated yesterday and is currently on Bipap.  RNCM attempted to reach out to family members with no answer, will attempt again at later time.   Expected Discharge Plan: (undetermined) Barriers to Discharge: Continued Medical Work up   Patient Goals and CMS Choice Patient states their goals for this hospitalization and ongoing recovery are:: Unable to voice, pt on Bipap      Expected Discharge Plan and Services Expected Discharge Plan: (undetermined)                                              Prior Living Arrangements/Services              Need for Family Participation in Patient Care: Yes (Comment)(COPD) Care giver support system in place?: Yes (comment)      Activities of Daily Living Home Assistive Devices/Equipment: None ADL Screening (condition at time of admission) Patient's cognitive ability adequate to safely complete daily activities?: No Is the patient deaf or have difficulty hearing?: No Does the patient have difficulty seeing, even when wearing glasses/contacts?: No Does the patient have difficulty concentrating, remembering, or making decisions?: No Patient able to express need for assistance with ADLs?: No Does the patient have difficulty dressing or bathing?: No Independently performs ADLs?: No Does the patient have difficulty walking or climbing stairs?: Yes Weakness of Legs: Both Weakness of Arms/Hands: Both  Permission Sought/Granted                  Emotional  Assessment Appearance:: Appears stated age Attitude/Demeanor/Rapport: Unable to Assess(Pt on bipap) Affect (typically observed): Unable to Assess   Alcohol / Substance Use: Alcohol Use Psych Involvement: No (comment)  Admission diagnosis:  Acidosis [E87.2] Hypoxia [R09.02] Hypothermia, initial encounter [T68.XXXA] Hypotension, unspecified hypotension type [I95.9] Sepsis, due to unspecified organism, unspecified whether acute organ dysfunction present [facility]) [A41.9] Patient Active Problem List   Diagnosis Date Noted  . Acute hypercapnic respiratory failure (HCC) 11/08/2018  . Pulmonary emphysema (HCC) 04/28/2017  . 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 [place] [address] Phone: [phone] Fax: [phone]     Social Determinants of Health (SDOH) Interventions    Readmission Risk Interventions No flowsheet data found.

## 2018-11-13 NOTE — Plan of Care (Signed)

## 2018-11-13 NOTE — Progress Notes (Signed)
SLP Cancellation Note  Patient Details Name: SHANEQWA BOBERG MRN: [id number] DOB: 1951/04/21   Cancelled treatment:       Reason Eval/Treat Not Completed: Patient not medically ready  The patient was on biPAP, nursing switched her to nasal cannulae.  Patient positioned upright.  Patient given 3 ice chips then 2 spoon sips water.  Her oxygen sats went down.  Trials were stopped, nursing put biPAP back on.  Oxygen saturation came back up.  SLP will continue to monitor for readiness for PO trials.  Dollene Primrose, MS/CCC- SLP  Leandrew Koyanagi 11/13/2018, 10:48 AM

## 2018-11-14 ENCOUNTER — Inpatient Hospital Stay: Payer: Medicare HMO

## 2018-11-14 LAB — CBC
HCT: 45.6 % (ref 36.0–46.0)
Hemoglobin: 13.9 g/dL (ref 12.0–15.0)
MCH: 30 pg (ref 26.0–34.0)
MCHC: 30.5 g/dL (ref 30.0–36.0)
MCV: 98.5 fL (ref 80.0–100.0)
Platelets: 201 10*3/uL (ref 150–400)
RBC: 4.63 MIL/uL (ref 3.87–5.11)
RDW: 16.2 % — ABNORMAL HIGH (ref 11.5–15.5)
WBC: 7.9 10*3/uL (ref 4.0–10.5)
nRBC: 0 % (ref 0.0–0.2)

## 2018-11-14 LAB — BLOOD GAS, ARTERIAL
Acid-Base Excess: 17.4 mmol/L — ABNORMAL HIGH (ref 0.0–2.0)
Bicarbonate: 44.4 mmol/L — ABNORMAL HIGH (ref 20.0–28.0)
FIO2: 0.5
Mechanical Rate: 14
O2 Saturation: 97.2 %
Patient temperature: 37
pCO2 arterial: 61 mmHg — ABNORMAL HIGH (ref 32.0–48.0)
pH, Arterial: 7.47 — ABNORMAL HIGH (ref 7.350–7.450)
pO2, Arterial: 87 mmHg (ref 83.0–108.0)

## 2018-11-14 LAB — COMPREHENSIVE METABOLIC PANEL
ALT: 302 U/L — ABNORMAL HIGH (ref 0–44)
AST: 49 U/L — ABNORMAL HIGH (ref 15–41)
Albumin: 3.2 g/dL — ABNORMAL LOW (ref 3.5–5.0)
Alkaline Phosphatase: 53 U/L (ref 38–126)
Anion gap: 13 (ref 5–15)
BUN: 28 mg/dL — ABNORMAL HIGH (ref 8–23)
CO2: 36 mmol/L — ABNORMAL HIGH (ref 22–32)
Calcium: 9.1 mg/dL (ref 8.9–10.3)
Chloride: 95 mmol/L — ABNORMAL LOW (ref 98–111)
Creatinine, Ser: 0.67 mg/dL (ref 0.44–1.00)
GFR calc Af Amer: >60 mL/min (ref >60)
GFR calc non Af Amer: >60 mL/min (ref >60)
Glucose, Bld: 123 mg/dL — ABNORMAL HIGH (ref 70–99)
Potassium: 4.6 mmol/L (ref 3.5–5.1)
Sodium: 144 mmol/L (ref 135–145)
Total Bilirubin: 1.6 mg/dL — ABNORMAL HIGH (ref 0.3–1.2)
Total Protein: 6.3 g/dL — ABNORMAL LOW (ref 6.5–8.1)

## 2018-11-14 LAB — GLUCOSE, CAPILLARY
Glucose-Capillary: 94 mg/dL (ref 70–99)
Glucose-Capillary: 89 mg/dL (ref 70–99)

## 2018-11-14 LAB — PHOSPHORUS: Phosphorus: 3.9 mg/dL (ref 2.5–4.6)

## 2018-11-14 LAB — MAGNESIUM: Magnesium: 2.4 mg/dL (ref 1.7–2.4)

## 2018-11-14 LAB — TROPONIN I
Troponin I: <0.03 ng/mL (ref <0.03)
Troponin I: <0.03 ng/mL (ref <0.03)
Troponin I: <0.03 ng/mL (ref <0.03)

## 2018-11-14 MED ORDER — AMIODARONE IV BOLUS ONLY 150 MG/100ML
INTRAVENOUS | Status: AC
  Filled 2018-11-14: qty 100

## 2018-11-14 MED ORDER — AMIODARONE IV BOLUS ONLY 150 MG/100ML
150.0000 mg | Freq: Once | INTRAVENOUS | Status: DC
  Administered 2018-11-14: 150 mg via INTRAVENOUS
  Filled 2018-11-14: qty 100

## 2018-11-14 MED ORDER — HYDRALAZINE HCL 20 MG/ML IJ SOLN
INTRAMUSCULAR | Status: AC
  Administered 2018-11-14: 14:00:00 20 mg via INTRAVENOUS
  Filled 2018-11-14: qty 1

## 2018-11-14 MED ORDER — LABETALOL HCL 5 MG/ML IV SOLN
5.0000 mg | Freq: Once | INTRAVENOUS | Status: AC
  Administered 2018-11-14: 5 mg via INTRAVENOUS
  Filled 2018-11-14: qty 4

## 2018-11-14 MED ORDER — AMIODARONE HCL IN DEXTROSE 360-4.14 MG/200ML-% IV SOLN: 60.0000 mg/h | INTRAVENOUS | Status: DC

## 2018-11-14 MED ORDER — LISINOPRIL 20 MG PO TABS
40.0000 mg | ORAL_TABLET | Freq: Every day | ORAL | Status: DC
  Administered 2018-11-14 – 2018-11-17 (×4): 40 mg via ORAL
  Filled 2018-11-14 (×4): qty 2

## 2018-11-14 MED ORDER — CHLORHEXIDINE GLUCONATE CLOTH 2 % EX PADS
6.0000 | MEDICATED_PAD | Freq: Every day | CUTANEOUS | Status: DC
  Administered 2018-11-13 – 2018-11-17 (×3): 6 via TOPICAL

## 2018-11-14 MED ORDER — GUAIFENESIN-DM 100-10 MG/5ML PO SYRP
5.0000 mL | ORAL_SOLUTION | ORAL | Status: DC | PRN
  Administered 2018-11-14: 5 mL via ORAL
  Filled 2018-11-14: qty 5

## 2018-11-14 MED ORDER — LABETALOL HCL 5 MG/ML IV SOLN
10.0000 mg | Freq: Once | INTRAVENOUS | Status: AC
  Administered 2018-11-14: 17:00:00 10 mg via INTRAVENOUS
  Filled 2018-11-14: qty 4

## 2018-11-14 MED ORDER — AMIODARONE HCL IN DEXTROSE 360-4.14 MG/200ML-% IV SOLN: 30.0000 mg/h | INTRAVENOUS | Status: DC

## 2018-11-14 MED ORDER — AMIODARONE LOAD VIA INFUSION
150.0000 mg | Freq: Once | INTRAVENOUS | Status: AC
  Administered 2018-11-14: 150 mg via INTRAVENOUS
  Filled 2018-11-14: qty 83.34

## 2018-11-14 MED ORDER — AMLODIPINE BESYLATE 5 MG PO TABS
5.0000 mg | ORAL_TABLET | Freq: Every day | ORAL | Status: DC
  Administered 2018-11-14 – 2018-11-17 (×4): 5 mg via ORAL
  Filled 2018-11-14 (×4): qty 1

## 2018-11-14 MED ORDER — HYDRALAZINE HCL 20 MG/ML IJ SOLN
20.0000 mg | Freq: Once | INTRAMUSCULAR | Status: AC
  Administered 2018-11-14: 20 mg via INTRAVENOUS

## 2018-11-14 NOTE — Consult Note (Signed)
PHARMACY CONSULT NOTE - FOLLOW UP  Pharmacy Consult for Electrolyte Monitoring and Replacement   Recent Labs: Potassium (mmol/L)  Date Value  11/14/2018 4.6  08/30/2012 3.8   Magnesium (mg/dL)  Date Value  11/14/2018 2.4   Calcium (mg/dL)  Date Value  11/14/2018 9.1   Calcium, Total (mg/dL)  Date Value  08/30/2012 8.9   Albumin (g/dL)  Date Value  11/14/2018 3.2 (L)  08/30/2012 3.4   Phosphorus (mg/dL)  Date Value  11/14/2018 3.9   Sodium (mmol/L)  Date Value  11/14/2018 144  08/30/2012 132 (L)    Assessment: 68 y.o. female with a history of COPD and asthma who was brought into the ED by EMS with report of passing out after drinking some wine the night before. She has noted electrolyte abnormalities  Goal of Therapy:  K ~ 4, magnesium ~2, phosphorous 2.5-4.6, calcium 8-10  Plan:  K 4.6  Mag 2.4  Phos 3.9  Scr 0.67 --No further electrolyte supplementation for today --F/U labs in the am and replace as needed  Noralee Space ,PharmD Clinical Pharmacist 11/14/2018 1:23 PM

## 2018-11-14 NOTE — Progress Notes (Signed)
[facility] [place] NAME: Kiyana Vazguez    MR#:  [id number]  DATE OF BIRTH:  1950/07/18  SUBJECTIVE:  CHIEF COMPLAINT:   Chief Complaint  Patient presents with  . Unresponsive   Patient was extubated on 11/12/2018.  Was on BiPAP yesterday.  Today on nasal cannula.  Patient is awake and alert.  REVIEW OF SYSTEMS:  Review of Systems  Constitutional: Positive for malaise/fatigue. Negative for chills and fever.  HENT: Negative for hearing loss and tinnitus.   Eyes: Negative for blurred vision and double vision.  Respiratory: Positive for cough and shortness of breath.   Cardiovascular: Negative for chest pain and palpitations.  Gastrointestinal: Negative for heartburn, nausea and vomiting.  Genitourinary: Negative for dysuria and urgency.  Musculoskeletal: Positive for myalgias. Negative for back pain and neck pain.  Skin: Negative for itching and rash.  Neurological: Negative for dizziness and headaches.  Psychiatric/Behavioral: Negative for depression and hallucinations.     DRUG ALLERGIES:  No Known Allergies VITALS:  Blood pressure (!) 170/92, pulse (!) 116, temperature 98.4 F (36.9 C), temperature source Axillary, resp. rate (!) 23, height 5' 2.52" (1.588 m), weight 72.2 kg, SpO2 100 %. PHYSICAL EXAMINATION:  Physical Exam   GENERAL:  68 y.o.-year-old patient lying in the bed. EYES: Pupils equal, round, reactive to light and accommodation. No scleral icterus. Extraocular muscles intact.  HEENT: Head atraumatic, normocephalic. Oropharynx and nasopharynx clear.  NECK:  Supple, no jugular venous distention. No thyroid enlargement, no tenderness.  LUNGS: Bilateral wheezing CARDIOVASCULAR: S1, S2 normal. No murmurs, rubs, or gallops.  ABDOMEN: Soft, nontender, nondistended. Bowel sounds present. No organomegaly or mass.  NEURO: Awake and alert and oriented.  Following instructions moving all extremities. EXTREMITIES: No  cyanosis, clubbing or edema b/l.     PSYCHIATRIC: Patient is awake and alert.  LABORATORY PANEL:  Female CBC Recent Labs  Lab 11/14/18 0232  WBC 7.9  HGB 13.9  HCT 45.6  PLT 201   ------------------------------------------------------------------------------------------------------------------ Chemistries  Recent Labs  Lab 11/14/18 0232  NA 144  K 4.6  CL 95*  CO2 36*  GLUCOSE 123*  BUN 28*  CREATININE 0.67  CALCIUM 9.1  MG 2.4  AST 49*  ALT 302*  ALKPHOS 53  BILITOT 1.6*   RADIOLOGY:  Dg Chest Port 1 View  Result Date: 11/14/2018 CLINICAL DATA:  COPD.  Shortness of breath. EXAM: PORTABLE CHEST 1 VIEW COMPARISON:  11/13/2018. FINDINGS: The heart size is stable. There are persistent prominent interstitial lung markings. There is a hazy opacity overlying the right lower lung zone. There is no pneumothorax. No large focal area of consolidation. IMPRESSION: Stable appearance of the chest. Persistent opacity at the right lung base favored to represent a right-sided pleural effusion with adjacent atelectasis. Electronically Signed   By: Constance Holster M.D.   On: 11/14/2018 03:13   ASSESSMENT AND PLAN:   Patient is a 68 year old female with history of asthma and COPD being admitted to the ICU for further management of acute hypercapnic respiratory failure  *Acute hypercapnic respiratory failure Patient with known history of COPD and asthma. Patient had to be intubated in the emergency room due to evidence of hypercapnic respiratory failure . Being managed by intensive care team.  Patient extubated on 11/12/2018. On [place] this morning. Patient said to be high risk for reintubation.  Has history of alcohol abuse/possible DTs  *Mild hyperkalemia  with potassium of 5.3. Follow-up on repeat potassium level in a.m.  *  Septic shock Likely due to aspiration pneumonia; On Unasyn Patient was hypotensive and hypothermic. Patient also required Levophed drip Blood cultures  with coag negative staph which is likely contaminant   *Acute metabolic encephalopathy CT scan of the head normal.   Extubated on 11/12/2018..  Improving slowly  *Acute kidney injury Resolved with IV fluids.   * Shock liver LFTs trending down Follow AST, ALT and INR  *Coagulopathy. INR was 5.8. Down to 1.5   Due to shock liver  DVT proph Was on SCDs due to recent coagulopathy, which has been previously resolved Initiate Lovenox  All the records are reviewed and case discussed with Care Management/Social Worker. Management plans discussed with the patient, family and they are in agreement.  CODE STATUS: Full Code  TOTAL TIME TAKING CARE OF THIS PATIENT: 37 minutes.   POSSIBLE D/C IN 2-3 DAYS, DEPENDING ON CLINICAL CONDITION.  Molinda BailiffSrikar R Chenel Wernli M.D on 11/14/2018 at 1:20 PM  Between 7am to 6pm - Pager - [phone]  After 6pm go to [email or web address] - Social research officer, governmentpassword EPAS [facility]  [facility]  Office  [phone]  CC: Primary care physician; Martie RoundSpencer, Nicole, NP  Note: This dictation was prepared with Dragon dictation along with smaller phrase technology. Any transcriptional errors that result from this process are unintentional.

## 2018-11-14 NOTE — Progress Notes (Addendum)
Pt has remained alert and oriented to self and place with no c/o pain. CIWA WNL. Pt has transitioned from bipap 40% to 4LNC- NDN. Lung sounds clear to auscultation, SpO2 88-93%, RR 20s. Pt has developed a strong, productive tan/green cough. IS ordered- pt is able to hit 500 with a 750 goal. Pt educated on its use. Pt has remained in NSR/ST-pt stained in 130s -> hydralazine 20mg  given x1 with no response-> labetalol 10mg  given x1 with better results-HR 105, BP 147/79. Speech eval today> ice chips only for now. Patients aunt and mother updated via telephone today.

## 2018-11-14 NOTE — Progress Notes (Signed)
Pharmacy Antibiotic Note  Angela Berry is a 68 y.o. female admitted on 11/08/2018. Patient admitted via EMS after being intoxicated and passing out. Patient intubated in ED for acute hypercapnic respiratory failure on 5/31. Patient initially started on vancomycin and cefepime. MRSA PCR is negative. Pharmacy was consulted for Unasyn dosing. Leukocytosis is slightly improved today and she has been afebrile since the previous note. SCr was trending up but is lower today, although not yet back to baseline: will continue to monitor.   -This is day number 7 of total antibiotics and day number 6 of Unasyn.  Plan: continue Unasyn 3g IV Q6hr to cover possible CAP and aspiration event.    Height: 5' 2.52" (158.8 cm) Weight: 159 lb 2.8 oz (72.2 kg) IBW/kg (Calculated) : 51.3  Temp (24hrs), Avg:98.1 F (36.7 C), Min:97.1 F (36.2 C), Max:98.8 F (37.1 C)  Recent Labs  Lab 11/08/18 1138  11/08/18 1355  11/10/18 0359 11/11/18 0420 11/12/18 0346 11/13/18 0431 11/13/18 2058 11/14/18 0232  WBC  --   --   --    < > 17.8* 15.5* 9.2 9.8  --  7.9  CREATININE  --    < >  --    < > 1.47* 1.24* 0.93 0.68 0.69 0.67  LATICACIDVEN 2.4*  --  2.1*  --  1.4  --   --   --   --   --    < > = values in this interval not displayed.    Estimated Creatinine Clearance: 66.1 mL/min (by C-G formula based on SCr of 0.67 mg/dL).    Antimicrobials this admission: Cefepime 5/31 >> 6/1  Vancomycin 5/31 >> 5/31  Unasyn 6/1 >>   Dose adjustments this admission: N/A  Microbiology results: 5/31 BCx: MSSS 2/4 5/31 MRSA PCR: negative  5/31 COVID: negative   Thank you for allowing pharmacy to be a part of this patient's care.  Rowe Warman A, PharmD 11/14/2018 1:27 PM

## 2018-11-14 NOTE — Evaluation (Addendum)
Clinical/Bedside Swallow Evaluation Patient Details  Name: Angela Berry MRN: [id number] Date of Birth: 05-25-1951  Today's Date: 11/14/2018 Time: SLP Start Time (ACUTE ONLY): 24 SLP Stop Time (ACUTE ONLY): 1215 SLP Time Calculation (min) (ACUTE ONLY): 45 min  Past Medical History:  Past Medical History:  Diagnosis Date  . Asthma   . COPD (chronic obstructive pulmonary disease) ([place])   . Dyspnea    Past Surgical History:  Past Surgical History:  Procedure Laterality Date  . CRANIOPLASTY  1976  . EXPLORATORY LAPAROTOMY    . HYSTERECTOMY ABDOMINAL WITH SALPINGECTOMY     HPI: Angela Berry  is a 68 y.o. female with a known history of COPD and asthma who was brought into the emergency room by EMS with report of passing out after drinking some wine the night before.  EMS reported patient is staying with someone who found her unresponsive last night but EMS was not called until this morning.  EMS reported patient having pinpoint pupils.  Was given 5 mg of Narcan.  Patient woke up and became confused and agitated.  On arrival in the emergency room patient had to be given 5 mg of Haldol and 1 mg of Ativan due to agitation.  Laboratory studies done significant for hypokalemia with potassium of 6.4.  Chest x-ray with findings suggestive of CHF initially.  Patient was hypothermic and had to be placed on warming blanket.  Also became hypotensive.  Received over 1 to 2 L of IV fluid in the emergency room which was initially discontinued due to concern for CHF.  Blood gas results reviewed with evidence of acute hypercapnic respiratory failure.  Patient had to be intubated in the emergency room.  CT scan of the head done with no evidence of acute intracranial abnormalities.  Could not exclude acute sinusitis however.  Patient was started on broad-spectrum IV antibiotics with IV vancomycin and cefepime.  Central line with eft femoral line was placed by emergency room provider.  Patient requiring Levophed  drip.  Initially there was no bed available in the ICU.  I was later notified that her bed was open this evening and patient being admitted to the ICU for further evaluation and management.  Emergency room provider had discussed case with critical care physician at [facility] previously.  Patient already started on bicarb drip and Protonix drip.  Repeat potassium level already trending down to 5.6.     Assessment / Plan / Recommendation Clinical Impression   Patient presents with clinical s/s of oropharyngeal dysphagia c/b cognitive feeding deficits (I.e. difficulty follow simple 1-step commands during oral motor examination), decreased labial seal, decreased jaw ROM, and delayed throat clearing following ice chips & tsp thin liquids. No changes in respiratory status and/or vitals noted during PO intake. Intermittent coughing/expelling of secretions into [place] noted prior to PO intake and following several minutes after PO intake. However, patient remains at [place] for aspiration d/t hx of COPD, hx of respiratory issues (requiring BiPAP throughout day), noted confusion during evaluation, and recent intubation.   SLP recommendation at this time continues to be NPO with ice chips only SPARINGLY (given 100% supervision) following thorough oral care by trained staff members. Education provided to RN and patient re: diet recommendation and safe swallowing strategies when providing ice chips (I.e. 100% supervision, feeding assistance, small bites of ice chips at a time, upright 90 degrees in bed, slow rate, thorough oral care before PO intake). RN recommended to stop PO intake of ice chips if  patient demonstrates decreased alertness, coughing, choking, and/or changes in respiratory status - RN verbalized understanding and agreement to recommendations.  SLP will f/u re: tolerance of diet upgrade and determine least restrictive diet during hospital stay. Of note, consider f/u re: cognitive-communication  evaluation if noted confusion continues.   SLP Visit Diagnosis: Dysphagia, oropharyngeal phase (R13.12)    Aspiration Risk  Moderate aspiration risk    Diet Recommendation Ice chips PRN after oral care;NPO   Liquid Administration via: Spoon Medication Administration: Other (Comment)(Per MD/RN discretion) Supervision: Full supervision/cueing for compensatory strategies Compensations: Minimize environmental distractions;Slow rate;Small sips/bites Postural Changes: Seated upright at 90 degrees    Other  Recommendations Oral Care Recommendations: Oral care prior to ice chips;Staff/trained caregiver to provide oral care   Follow up Recommendations Other (comment)(TBD)      Frequency and Duration min 3x week  2 weeks       Prognosis Prognosis for Safe Diet Advancement: Fair Barriers to Reach Goals: Severity of deficits      Swallow Study   General Date of Onset: 11/14/18 Type of Study: Bedside Swallow Evaluation Previous Swallow Assessment: Previous SLP attempted BSE, however unable to complete d/t patient's respiratory status/condition. Diet Prior to this Study: NPO Temperature Spikes Noted: No Respiratory Status: Nasal cannula History of Recent Intubation: Yes Date extubated: 11/12/18 Behavior/Cognition: Alert;Confused;Distractible;Requires cueing Oral Cavity Assessment: Other (comment)(Moderate amount of white coating on lingual surface.) Oral Care Completed by SLP: Yes Oral Cavity - Dentition: Adequate natural dentition Self-Feeding Abilities: Needs assist Patient Positioning: Upright in bed Baseline Vocal Quality: Normal Volitional Cough: Strong Volitional Swallow: Able to elicit    Oral/Motor/Sensory Function Overall Oral Motor/Sensory Function: Generalized oral weakness Facial ROM: Within Functional Limits Facial Symmetry: Within Functional Limits Lingual ROM: Within Functional Limits Lingual Symmetry: Within Functional Limits Velum: Within Functional  Limits Mandible: Impaired(Difficulty following commands re: jaw ROM - diffcult to assess. Suspect possible weakness.)   Ice Chips Ice chips: Within functional limits Presentation: Spoon   Thin Liquid Thin Liquid: Within functional limits Presentation: Spoon    Nectar Thick Nectar Thick Liquid: Not tested   Honey Thick Honey Thick Liquid: Not tested   Puree Puree: Not tested   Solid     Solid: Not tested     Angela Berry, Angela Berry Speech-Language Pathologist  Angela Berry 11/14/2018,12:08 PM

## 2018-11-14 NOTE — Progress Notes (Signed)
CRITICAL CARE NOTE  CC  follow up respiratory failure  SUBJECTIVE Patient remains critically ill Prognosis is guarded On biPAP +wheezing Severe COPD exacerbation High risk for intubation Severe ETOH abuse, high risk for DT's   BP (!) 157/88   Pulse 96   Temp 98.2 F (36.8 C) (Axillary)   Resp 18   Ht 5' 2.52" (1.588 m)   Wt 72.2 kg   SpO2 94%   BMI 28.63 kg/m    I/O last 3 completed shifts: In: 953.4 [I.V.:118; Other:15; IV Piggyback:820.4] Out: 4380 [Urine:4380] No intake/output data recorded.  SpO2: 94 % O2 Flow Rate (L/min): 4 L/min FiO2 (%): 40 %   SIGNIFICANT EVENTS 5/31 Admitted for severe COPD exacerbation severe respiratory failure 6/1-6/3 on vent critically ill severe respiratory distress, held weaning trials 6/3 extubated 6/4 severe respiratory distress from severe COPD exacerbation placed on BiPAP 6/5 on biPAP, severe resp failure  REVIEW OF SYSTEMS  PATIENT IS UNABLE TO PROVIDE COMPLETE REVIEW OF SYSTEMS DUE TO SEVERE CRITICAL ILLNESS   PHYSICAL EXAMINATION:  GENERAL:critically ill appearing, +resp distress HEAD: Normocephalic, atraumatic.  EYES: Pupils equal, round, reactive to light.  No scleral icterus.  MOUTH: Moist mucosal membrane. NECK: Supple. No thyromegaly. No nodules. No JVD.  PULMONARY: +rhonchi, +wheezing CARDIOVASCULAR: S1 and S2. Regular rate and rhythm. No murmurs, rubs, or gallops.  GASTROINTESTINAL: Soft, nontender, -distended. No masses. Positive bowel sounds. No hepatosplenomegaly.  MUSCULOSKELETAL: No swelling, clubbing, or edema.  NEUROLOGIC: lethargic SKIN:intact,warm,dry  MEDICATIONS: I have reviewed all medications and confirmed regimen as documented   CULTURE RESULTS   Recent Results (from the past 240 hour(s))  Blood culture (routine x 2)     Status: None   Collection Time: 11/08/18 11:38 AM  Result Value Ref Range Status   Specimen Description BLOOD RIGHT ANTECUBITAL  Final   Special Requests   Final     BOTTLES DRAWN AEROBIC AND ANAEROBIC Blood Culture adequate volume   Culture   Final    NO GROWTH 5 DAYS Performed at [facility], [address] [place], [place] [address]    Report Status 11/13/2018 FINAL  Final  Blood culture (routine x 2)     Status: Abnormal   Collection Time: 11/08/18 11:38 AM  Result Value Ref Range Status   Specimen Description   Final    BLOOD LEFT ANTECUBITAL Performed at [facility], [address] [place], [place] [address]    Special Requests   Final    BOTTLES DRAWN AEROBIC AND ANAEROBIC Blood Culture adequate volume Performed at [facility], [address] [place], [place] [address]    Culture  Setup Time   Final    IN BOTH AEROBIC AND ANAEROBIC BOTTLES GRAM POSITIVE COCCI CRITICAL RESULT CALLED TO, READ BACK BY AND VERIFIED WITH: DAVID BESANTI AT 0326 ON 11/09/2018 JJB    Culture (A)  Final    STAPHYLOCOCCUS SPECIES (COAGULASE NEGATIVE) THE SIGNIFICANCE OF ISOLATING THIS ORGANISM FROM A SINGLE SET OF BLOOD CULTURES WHEN MULTIPLE SETS ARE DRAWN IS UNCERTAIN. PLEASE NOTIFY THE MICROBIOLOGY DEPARTMENT WITHIN ONE WEEK IF SPECIATION AND SENSITIVITIES ARE REQUIRED. Performed at [facility], [address] [place], [place] [address]    Report Status 11/11/2018 FINAL  Final  Blood Culture ID Panel (Reflexed)     Status: Abnormal   Collection Time: 11/08/18 11:38 AM  Result Value Ref Range Status   Enterococcus species NOT DETECTED NOT DETECTED Final   Listeria monocytogenes NOT DETECTED NOT DETECTED Final  Staphylococcus species DETECTED (A) NOT DETECTED Final    Comment: Methicillin (oxacillin) susceptible coagulase negative staphylococcus. Possible blood culture contaminant (unless isolated from more than one blood culture draw or clinical case suggests pathogenicity). No antibiotic treatment is indicated for blood  culture contaminants. CRITICAL RESULT CALLED TO, READ BACK BY AND VERIFIED WITH: DAVID BESANTI  AT 0326 ON 11/09/2018 JJB    Staphylococcus aureus (BCID) NOT DETECTED NOT DETECTED Final   Methicillin resistance NOT DETECTED NOT DETECTED Final   Streptococcus species NOT DETECTED NOT DETECTED Final   Streptococcus agalactiae NOT DETECTED NOT DETECTED Final   Streptococcus pneumoniae NOT DETECTED NOT DETECTED Final   Streptococcus pyogenes NOT DETECTED NOT DETECTED Final   Acinetobacter baumannii NOT DETECTED NOT DETECTED Final   Enterobacteriaceae species NOT DETECTED NOT DETECTED Final   Enterobacter cloacae complex NOT DETECTED NOT DETECTED Final   Escherichia coli NOT DETECTED NOT DETECTED Final   Klebsiella oxytoca NOT DETECTED NOT DETECTED Final   Klebsiella pneumoniae NOT DETECTED NOT DETECTED Final   Proteus species NOT DETECTED NOT DETECTED Final   Serratia marcescens NOT DETECTED NOT DETECTED Final   Haemophilus influenzae NOT DETECTED NOT DETECTED Final   Neisseria meningitidis NOT DETECTED NOT DETECTED Final   Pseudomonas aeruginosa NOT DETECTED NOT DETECTED Final   Candida albicans NOT DETECTED NOT DETECTED Final   Candida glabrata NOT DETECTED NOT DETECTED Final   Candida krusei NOT DETECTED NOT DETECTED Final   Candida parapsilosis NOT DETECTED NOT DETECTED Final   Candida tropicalis NOT DETECTED NOT DETECTED Final    Comment: Performed at [facility], [address] [place], [place] [address]  SARS Coronavirus 2 (CEPHEID - Performed in [place] hospital lab), Hosp Order     Status: None   Collection Time: 11/08/18 12:22 PM  Result Value Ref Range Status   SARS Coronavirus 2 NEGATIVE NEGATIVE Final    Comment: (NOTE) If result is NEGATIVE SARS-CoV-2 target nucleic acids are NOT DETECTED. The SARS-CoV-2 RNA is generally detectable in upper and lower  respiratory specimens during the acute phase of infection. The lowest  concentration of SARS-CoV-2 viral copies this assay can detect is 250  copies / mL. A negative result does not preclude SARS-CoV-2  infection  and should not be used as the sole basis for treatment or other  patient management decisions.  A negative result may occur with  improper specimen collection / handling, submission of specimen other  than nasopharyngeal swab, presence of viral mutation(s) within the  areas targeted by this assay, and inadequate number of viral copies  (<250 copies / mL). A negative result must be combined with clinical  observations, patient history, and epidemiological information. If result is POSITIVE SARS-CoV-2 target nucleic acids are DETECTED. The SARS-CoV-2 RNA is generally detectable in upper and lower  respiratory specimens dur ing the acute phase of infection.  Positive  results are indicative of active infection with SARS-CoV-2.  Clinical  correlation with patient history and other diagnostic information is  necessary to determine patient infection status.  Positive results do  not rule out bacterial infection or co-infection with other viruses. If result is PRESUMPTIVE POSTIVE SARS-CoV-2 nucleic acids MAY BE PRESENT.   A presumptive positive result was obtained on the submitted specimen  and confirmed on repeat testing.  While 2019 novel coronavirus  (SARS-CoV-2) nucleic acids may be present in the submitted sample  additional confirmatory testing may be necessary for epidemiological  and / or clinical management purposes  to differentiate between  SARS-CoV-2 and other Sarbecovirus currently known to infect humans.  If clinically indicated additional testing with an alternate test  methodology [phone] is advised. The SARS-CoV-2 RNA is generally  detectable in upper and lower respiratory sp ecimens during the acute  phase of infection. The expected result is Negative. Fact Sheet for Patients:  BoilerBrush.com.cyhttps://www.fda.gov/media/136312/download Fact Sheet for Healthcare Providers: https://pope.com/https://www.fda.gov/media/136313/download This test is not yet approved or cleared by the [place]  FDA and has been authorized for detection and/or diagnosis of SARS-CoV-2 by FDA under an Emergency Use Authorization (EUA).  This EUA will remain in effect (meaning this test can be used) for the duration of the COVID-19 declaration under Section 564(b)(1) of the Act, 21 U.S.C. section 360bbb-3(b)(1), unless the authorization is terminated or revoked sooner. Performed at [facility], [address] [place], [place] [address]   MRSA PCR Screening     Status: None   Collection Time: 11/08/18  7:39 PM  Result Value Ref Range Status   MRSA by PCR NEGATIVE NEGATIVE Final    Comment:        The GeneXpert MRSA Assay (FDA approved for NASAL specimens only), is one component of a comprehensive MRSA colonization surveillance program. It is not intended to diagnose MRSA infection nor to guide or monitor treatment for MRSA infections. Performed at [facility], [address] [place], [place] [address]           IMAGING    Dg Chest Port 1 View  Result Date: 11/14/2018 CLINICAL DATA:  COPD.  Shortness of breath. EXAM: PORTABLE CHEST 1 VIEW COMPARISON:  11/13/2018. FINDINGS: The heart size is stable. There are persistent prominent interstitial lung markings. There is a hazy opacity overlying the right lower lung zone. There is no pneumothorax. No large focal area of consolidation. IMPRESSION: Stable appearance of the chest. Persistent opacity at the right lung base favored to represent a right-sided pleural effusion with adjacent atelectasis. Electronically Signed   By: Katherine Mantlehristopher  Green M.D.   On: 11/14/2018 03:13   Dg Chest Port 1 View  Result Date: 11/13/2018 CLINICAL DATA:  Acute respiratory failure. EXAM: PORTABLE CHEST 1 VIEW COMPARISON:  11/11/2018 and 08/30/2012 FINDINGS: Endotracheal tube has been removed in the interim. Again noted are prominent interstitial lung markings. No focal airspace disease or consolidation. Improved aeration at the right lung base  but persistent blunting at the right costophrenic angle. Heart size is within normal limits. The trachea is midline. Negative for a pneumothorax. Deformity in the proximal right humerus is compatible with an old injury. IMPRESSION: Persistent prominent interstitial lung markings. Findings could represent a combination of chronic changes and interstitial edema. Interstitial densities are similar to the recent comparison examination. Blunting at the right costophrenic angle may represent a tiny right pleural effusion and/or atelectasis. Electronically Signed   By: Richarda OverlieAdam  Henn M.D.   On: 11/13/2018 11:13       Indwelling Urinary Catheter continued, requirement due to   Reason to continue Indwelling Urinary Catheter strict Intake/Output monitoring for hemodynamic instability   Central Line/ continued, requirement due to  Reason to continue [employer] Line Monitoring of central venous pressure or other hemodynamic parameters and poor IV access        ASSESSMENT AND PLAN SYNOPSIS  68 year old white female with extensive alcohol abuse with severe COPD exacerbation with acute hypoxic and hypercapnic respiratory failure with high risk for DTs Patient had been on ventilator now extubated on BiPAP High risk for reintubation    Severe ACUTE  Hypoxic and Hypercapnic Respiratory Failure -continue BIPAP  support -continue Bronchodilator Therapy High risk for re-intubation  SEVERE COPD EXACERBATION -continue IV steroids as prescribed -continue NEB THERAPY as prescribed -morphine as needed -wean fio2 as needed and tolerated   NEUROLOGY Severe agitation High risk for DT's CIWA protocol  CARDIAC ICU monitoring  ID -continue IV abx as prescibed -follow up cultures  GI GI PROPHYLAXIS as indicated  NUTRITIONAL STATUS DIET-->TF's as tolerated Constipation protocol as indicated  ENDO - will use ICU hypoglycemic\Hyperglycemia protocol if indicated   ELECTROLYTES -follow labs as  needed -replace as needed -pharmacy consultation and following   DVT/GI PRX ordered TRANSFUSIONS AS NEEDED MONITOR FSBS ASSESS the need for LABS as needed   Critical Care Time devoted to patient care services described in this note is 32 minutes.   Overall, patient is critically ill, prognosis is guarded.     Lucie LeatherKurian David Hayly Litsey, M.D.  [facility]  Medical Director [facility] Medical Director [facility] Cardio-Pulmonary Department

## 2018-11-15 LAB — GLUCOSE, CAPILLARY
Glucose-Capillary: 101 mg/dL — ABNORMAL HIGH (ref 70–99)
Glucose-Capillary: 95 mg/dL (ref 70–99)
Glucose-Capillary: 102 mg/dL — ABNORMAL HIGH (ref 70–99)

## 2018-11-15 LAB — BASIC METABOLIC PANEL
Anion gap: 9 (ref 5–15)
BUN: 28 mg/dL — ABNORMAL HIGH (ref 8–23)
CO2: 36 mmol/L — ABNORMAL HIGH (ref 22–32)
Calcium: 9.1 mg/dL (ref 8.9–10.3)
Chloride: 95 mmol/L — ABNORMAL LOW (ref 98–111)
Creatinine, Ser: 0.65 mg/dL (ref 0.44–1.00)
GFR calc Af Amer: >60 mL/min (ref >60)
GFR calc non Af Amer: >60 mL/min (ref >60)
Glucose, Bld: 101 mg/dL — ABNORMAL HIGH (ref 70–99)
Potassium: 3.9 mmol/L (ref 3.5–5.1)
Sodium: 140 mmol/L (ref 135–145)

## 2018-11-15 LAB — PHOSPHORUS: Phosphorus: 3.8 mg/dL (ref 2.5–4.6)

## 2018-11-15 LAB — MAGNESIUM: Magnesium: 2.2 mg/dL (ref 1.7–2.4)

## 2018-11-15 LAB — TROPONIN I: Troponin I: <0.03 ng/mL (ref <0.03)

## 2018-11-15 MED ORDER — METOPROLOL SUCCINATE ER 50 MG PO TB24
50.0000 mg | ORAL_TABLET | Freq: Every day | ORAL | Status: DC
  Administered 2018-11-15 – 2018-11-17 (×3): 50 mg via ORAL
  Filled 2018-11-15 (×3): qty 1

## 2018-11-15 MED ORDER — LABETALOL HCL 5 MG/ML IV SOLN
INTRAVENOUS | Status: AC
  Administered 2018-11-15: 10 mg via INTRAVENOUS
  Filled 2018-11-15: qty 4

## 2018-11-15 MED ORDER — PANTOPRAZOLE SODIUM 40 MG PO TBEC
40.0000 mg | DELAYED_RELEASE_TABLET | Freq: Two times a day (BID) | ORAL | Status: DC
  Administered 2018-11-15 – 2018-11-17 (×5): 40 mg via ORAL
  Filled 2018-11-15 (×5): qty 1

## 2018-11-15 MED ORDER — IPRATROPIUM-ALBUTEROL 0.5-2.5 (3) MG/3ML IN SOLN
3.0000 mL | Freq: Three times a day (TID) | RESPIRATORY_TRACT | Status: DC
  Administered 2018-11-16 – 2018-11-17 (×4): 3 mL via RESPIRATORY_TRACT
  Filled 2018-11-15 (×6): qty 3

## 2018-11-15 MED ORDER — AMIODARONE IV BOLUS ONLY 150 MG/100ML
INTRAVENOUS | Status: AC
  Administered 2018-11-15: 05:00:00 150 mg via INTRAVENOUS
  Filled 2018-11-15: qty 100

## 2018-11-15 MED ORDER — LABETALOL HCL 5 MG/ML IV SOLN
5.0000 mg | Freq: Once | INTRAVENOUS | Status: AC
  Administered 2018-11-15: 5 mg via INTRAVENOUS

## 2018-11-15 MED ORDER — LABETALOL HCL 5 MG/ML IV SOLN
5.0000 mg | INTRAVENOUS | Status: DC | PRN
  Administered 2018-11-15: 10 mg via INTRAVENOUS

## 2018-11-15 MED ORDER — TRAZODONE HCL 100 MG PO TABS
100.0000 mg | ORAL_TABLET | Freq: Every day | ORAL | Status: DC
  Administered 2018-11-15 – 2018-11-16 (×2): 100 mg via ORAL
  Filled 2018-11-15 (×2): qty 1

## 2018-11-15 MED ORDER — LABETALOL HCL 5 MG/ML IV SOLN
INTRAVENOUS | Status: AC
  Administered 2018-11-15: 5 mg via INTRAVENOUS
  Filled 2018-11-15: qty 4

## 2018-11-15 MED ORDER — AMIODARONE IV BOLUS ONLY 150 MG/100ML
150.0000 mg | Freq: Once | INTRAVENOUS | Status: AC
  Administered 2018-11-15: 150 mg via INTRAVENOUS

## 2018-11-15 MED ORDER — FOLIC ACID 1 MG PO TABS
1.0000 mg | ORAL_TABLET | Freq: Every day | ORAL | Status: DC
  Administered 2018-11-15 – 2018-11-17 (×3): 1 mg via ORAL
  Filled 2018-11-15 (×3): qty 1

## 2018-11-15 MED ORDER — VITAMIN B-1 100 MG PO TABS
100.0000 mg | ORAL_TABLET | Freq: Every day | ORAL | Status: DC
  Administered 2018-11-15 – 2018-11-17 (×3): 100 mg via ORAL
  Filled 2018-11-15 (×3): qty 1

## 2018-11-15 NOTE — Progress Notes (Addendum)
Pt has remained alert and oriented with no complaints of pain. CIWA 0 -> no interventions. Pt has remained on 3LNC, SpO2 88-93%, lung sounds diminished to auscultation with intermittent wheezes. IS goal 750 today. Pt has a strong, productive cough with thick tan, greenish secretions. Pt has remained in ST- low 100s with x1 nonsustaining AFRVR. Dr Mortimer Fries made aware-> restarted home dose metoprolol 50mg  qd. Pt has tolerated a cardiac diet - NDN. Pt is currently d/t void-> foley removed at 1000. Pt has briefly ambulated with PT this am. Pt has transferred to recliner at bedside with walker x1 assist. Pt does report weakness in bilat legs. Pt with orders to transfer to telemetry- report given to [place], Therapist, sports.

## 2018-11-15 NOTE — Progress Notes (Signed)
[facility] [place] [facility]   PATIENT NAME: Angela Berry    MR#:  [id number]  DATE OF BIRTH:  [birth year]  SUBJECTIVE:  CHIEF COMPLAINT:   Chief Complaint  Patient presents with  . Unresponsive   Patient was extubated on 11/12/2018.  Today on just 1 L oxygen.  Feels much improved.  REVIEW OF SYSTEMS:  Review of Systems  Constitutional: Positive for malaise/fatigue. Negative for chills and fever.  HENT: Negative for hearing loss and tinnitus.   Eyes: Negative for blurred vision and double vision.  Respiratory: Positive for cough and shortness of breath.   Cardiovascular: Negative for chest pain and palpitations.  Gastrointestinal: Negative for heartburn, nausea and vomiting.  Genitourinary: Negative for dysuria and urgency.  Musculoskeletal: Positive for myalgias. Negative for back pain and neck pain.  Skin: Negative for itching and rash.  Neurological: Negative for dizziness and headaches.  Psychiatric/Behavioral: Negative for depression and hallucinations.     DRUG ALLERGIES:  No Known Allergies VITALS:  Blood pressure (!) 163/88, pulse 96, temperature 98.4 F (36.9 C), temperature source Oral, resp. rate 20, height 5' 2.52" (1.588 m), weight 74.9 kg, SpO2 93 %. PHYSICAL EXAMINATION:  Physical Exam   GENERAL:  68 y.o.-year-old patient lying in the bed. EYES: Pupils equal, round, reactive to light and accommodation. No scleral icterus. Extraocular muscles intact.  HEENT: Head atraumatic, normocephalic. Oropharynx and nasopharynx clear.  NECK:  Supple, no jugular venous distention. No thyroid enlargement, no tenderness.  LUNGS: Bilateral wheezing CARDIOVASCULAR: S1, S2 normal. No murmurs, rubs, or gallops.  ABDOMEN: Soft, nontender, nondistended. Bowel sounds present. No organomegaly or mass.  NEURO: Awake and alert and oriented.  Following instructions moving all extremities. EXTREMITIES: No cyanosis, clubbing or edema b/l.     PSYCHIATRIC:  Patient is awake and alert.  LABORATORY PANEL:  Female CBC Recent Labs  Lab 11/14/18 0232  WBC 7.9  HGB 13.9  HCT 45.6  PLT 201   ------------------------------------------------------------------------------------------------------------------ Chemistries  Recent Labs  Lab 11/14/18 0232 11/15/18 0324  NA 144 140  K 4.6 3.9  CL 95* 95*  CO2 36* 36*  GLUCOSE 123* 101*  BUN 28* 28*  CREATININE 0.67 0.65  CALCIUM 9.1 9.1  MG 2.4 2.2  AST 49*  --   ALT 302*  --   ALKPHOS 53  --   BILITOT 1.6*  --    RADIOLOGY:  No results found. ASSESSMENT AND PLAN:   Patient is a 68 year old female with history of asthma and COPD being admitted to the ICU for further management of acute hypercapnic respiratory failure  *Acute hypercapnic respiratory failure Due to COPD exacerbation and pneumonia Patient had to be intubated in the emergency room due to evidence of hypercapnic respiratory failure .  Patient extubated on 11/12/2018. On [place] this morning. Wean off oxygen as tolerated  *Mild hyperkalemia  with potassium of 5.3. Follow-up on repeat potassium level in a.m.  *Septic shock Likely due to aspiration pneumonia; On Unasyn Septic shock resolved.  Off pressors.  *Acute metabolic encephalopathy CT scan of the head normal.   Extubated on 11/12/2018.Marland Kitchen. Resolved  *Acute kidney injury Resolved with IV fluids.   * Shock liver LFTs trending down Follow AST, ALT and INR Much improved  *Coagulopathy. INR was 5.8. Improved  DVT proph Was on SCDs due to recent coagulopathy, which has been previously resolved Initiate Lovenox  All the records are reviewed and case discussed with Care Management/Social Worker. Management plans discussed with the patient, family  and they are in agreement.  CODE STATUS: Full Code  TOTAL TIME TAKING CARE OF THIS PATIENT: 30 minutes.   POSSIBLE D/C IN 2-3 DAYS, DEPENDING ON CLINICAL CONDITION.  Leia Alf Hayly Litsey M.D on 11/15/2018  at 12:33 PM  Between 7am to 6pm - Pager - [phone]  After 6pm go to [email or web address] - Proofreader  [facility]  Office  [phone]  CC: Primary care physician; Elisabeth Cara, NP  Note: This dictation was prepared with Dragon dictation along with smaller phrase technology. Any transcriptional errors that result from this process are unintentional.

## 2018-11-15 NOTE — Progress Notes (Signed)
Pharmacy Antibiotic Note  Angela Berry is a 68 y.o. female admitted on 11/08/2018. Patient admitted via EMS after being intoxicated and passing out. Patient intubated in ED for acute hypercapnic respiratory failure on 5/31. Patient initially started on vancomycin and cefepime. MRSA PCR is negative. Pharmacy was consulted for Unasyn dosing. Leukocytosis is slightly improved today and she has been afebrile since the previous note. SCr was trending up but is lower today, although not yet back to baseline: will continue to monitor. -?Aspiration PNA, ETOH abuse   -This is day number 8 of total antibiotics and day number 7 of Unasyn.  Plan: continue Unasyn 3g IV Q6hr to cover possible CAP and aspiration event.    Height: 5' 2.52" (158.8 cm) Weight: 165 lb 2 oz (74.9 kg) IBW/kg (Calculated) : 51.3  Temp (24hrs), Avg:98.7 F (37.1 C), Min:98.4 F (36.9 C), Max:98.9 F (37.2 C)  Recent Labs  Lab 11/08/18 1138  11/08/18 1355  11/10/18 0359 11/11/18 0420 11/12/18 0346 11/13/18 0431 11/13/18 2058 11/14/18 0232 11/15/18 0324  WBC  --   --   --    < > 17.8* 15.5* 9.2 9.8  --  7.9  --   CREATININE  --    < >  --    < > 1.47* 1.24* 0.93 0.68 0.69 0.67 0.65  LATICACIDVEN 2.4*  --  2.1*  --  1.4  --   --   --   --   --   --    < > = values in this interval not displayed.    Estimated Creatinine Clearance: 67.2 mL/min (by C-G formula based on SCr of 0.65 mg/dL).    Antimicrobials this admission: Cefepime 5/31 >> 6/1  Vancomycin 5/31 >> 5/31  Unasyn 6/1 >>   Dose adjustments this admission: N/A  Microbiology results: 5/31 BCx: MSSS 2/4 5/31 MRSA PCR: negative  5/31 COVID: negative   Thank you for allowing pharmacy to be a part of this patient's care.  Carmon Brigandi A, PharmD 11/15/2018 10:32 AM

## 2018-11-15 NOTE — Evaluation (Signed)
Physical Therapy Evaluation Patient Details Name: Angela MorganSusan E Berry MRN: [id number] DOB: 02-Oct-1950 Today's Date: 11/15/2018   History of Present Illness  68 year old female with history of asthma and COPD being admitted for management of acute hypercapnic respiratory failure.  Pt needed to be intubated for 3 days, on 3L O2 at time of PT exam.  Clinical Impression  Pt eager to do as much as she could with PT but was surprised at how weak she was and her amount of reliance on the walker.  She had one significant bout of buckling needing assist to stay upright and though her vitals were decent (HR did climb to 120s, O2 remained mid 90s on 3L) she was very fatigued with the effort and was very reliant on AD which is new for her.  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      Follow Up Recommendations SNF(Pt very much hoping to improve enough to go home)    Equipment Recommendations  Rolling walker with 5" wheels    Recommendations for Other Services       Precautions / Restrictions Precautions Precautions: Fall Restrictions Weight Bearing Restrictions: No      Mobility  Bed Mobility Overal bed mobility: Modified Independent                Transfers Overall transfer level: Needs assistance Equipment used: Rolling walker (2 wheeled) Transfers: Sit to/from Stand Sit to Stand: Min assist;Min guard         General transfer comment: Pt attempted to rise w/o using UEs on handrails and was unsuccessful, with cuing for appropriate use able to rise with only CGA  Ambulation/Gait Ambulation/Gait assistance: Min assist;Min guard Gait Distance (Feet): 50 Feet Assistive device: Rolling walker (2 wheeled)       General Gait Details: Pt was able to ambulate into the hallway with slow but relatively confident gait  but then had episode of buckling and needed assist and heavy use of UE to maintian standing.  Pt more reliant on walker in returning to room.  Pt on 3 liters O2 t/o the effort with sats remaining in the mid 90, HR did increase from 80s up to 120s with first ambulation attempt since extubation.  Stairs            Wheelchair Mobility    Modified Rankin (Stroke Patients Only)       Balance Overall balance assessment: Modified Independent                                           Pertinent Vitals/Pain Pain Assessment: No/denies pain    Home Living Family/patient expects to be discharged to:: Private residence Living Arrangements: Parent   Type of Home: House Home Access: Stairs to enter Entrance Stairs-Rails: Can reach both Entrance Stairs-Number of Steps: 4 down then 3 up from car   Home Equipment: (mother has RW)      Prior Function Level of Independence: Independent         Comments: Pt drives, runs errands, cares for mother     Hand Dominance        Extremity/Trunk Assessment   Upper Extremity Assessment Upper Extremity Assessment: Overall WFL for  tasks assessed;Generalized weakness    Lower Extremity Assessment Lower Extremity Assessment: Overall WFL for tasks assessed;Generalized weakness       Communication   Communication: No difficulties  Cognition Arousal/Alertness: Awake/alert Behavior During Therapy: WFL for tasks assessed/performed Overall Cognitive Status: Within Functional Limits for tasks assessed                                        General Comments      Exercises     Assessment/Plan    PT Assessment    PT Problem List         PT Treatment Interventions      PT Goals (Current goals can be found in the Care Plan section)  Acute Rehab PT Goals Patient Stated Goal: go home, not rehab PT Goal Formulation: With patient Time For Goal Achievement: 11/29/18 Potential to Achieve Goals:  Good    Frequency Min 2X/week   Barriers to discharge        Co-evaluation               AM-PAC PT "6 Clicks" Mobility  Outcome Measure Help needed turning from your back to your side while in a flat bed without using bedrails?: None Help needed moving from lying on your back to sitting on the side of a flat bed without using bedrails?: None Help needed moving to and from a bed to a chair (including a wheelchair)?: A Little Help needed standing up from a chair using your arms (e.g., wheelchair or bedside chair)?: A Little Help needed to walk in hospital room?: A Little Help needed climbing 3-5 steps with a railing? : A Lot 6 Click Score: 19    End of Session Equipment Utilized During Treatment: Gait belt;Oxygen(3L) Activity Tolerance: Patient limited by fatigue Patient left: in chair;with call bell/phone within reach Nurse Communication: Mobility status PT Visit Diagnosis: Muscle weakness (generalized) (M62.81);Difficulty in walking, not elsewhere classified (R26.2)    Time: 2229-7989 PT Time Calculation (min) (ACUTE ONLY): 29 min   Charges:   PT Evaluation $PT Eval Low Complexity: 1 Low PT Treatments $Gait Training: 8-22 mins        Kreg Shropshire, DPT 11/15/2018, 1:46 PM

## 2018-11-15 NOTE — Consult Note (Signed)
PHARMACY CONSULT NOTE - FOLLOW UP  Pharmacy Consult for Electrolyte Monitoring and Replacement   Recent Labs: Potassium (mmol/L)  Date Value  11/15/2018 3.9  08/30/2012 3.8   Magnesium (mg/dL)  Date Value  11/15/2018 2.2   Calcium (mg/dL)  Date Value  11/15/2018 9.1   Calcium, Total (mg/dL)  Date Value  08/30/2012 8.9   Albumin (g/dL)  Date Value  11/14/2018 3.2 (L)  08/30/2012 3.4   Phosphorus (mg/dL)  Date Value  11/15/2018 3.8   Sodium (mmol/L)  Date Value  11/15/2018 140  08/30/2012 132 (L)    Assessment: 68 y.o. female with a history of COPD and asthma who was brought into the ED by EMS with report of passing out after drinking some wine the night before. She has noted electrolyte abnormalities.  Pt w/ aspiration PNA, ETOH abuse  Goal of Therapy:  K ~ 4, magnesium ~2, phosphorous 2.5-4.6, calcium 8-10  Plan:  K 3.9  Mag 2.2  Phos 3.8  Scr 0.65 --No further electrolyte supplementation for today --F/U labs in the am and replace as needed  Noralee Space ,PharmD Clinical Pharmacist 11/15/2018 10:27 AM

## 2018-11-15 NOTE — Progress Notes (Addendum)
CRITICAL CARE NOTE  CC  follow up respiratory failure  SUBJECTIVE intermittent afib last night Will need to assess for anticoagulation in setting of severe ETOH abuse Needs cardiology input +COPD exacerbation improving  BP 133/69   Pulse 88   Temp 98.7 F (37.1 C) (Oral)   Resp 20   Ht 5' 2.52" (1.588 m)   Wt 74.9 kg   SpO2 100%   BMI 29.70 kg/m    I/O last 3 completed shifts: In: 715.4 [P.O.:120; I.V.:75.1; IV Piggyback:520.3] Out: 3700 [Urine:3700] No intake/output data recorded.  SpO2: 100 % O2 Flow Rate (L/min): 2 L/min FiO2 (%): 40 %   SIGNIFICANT EVENTS 5/31 Admitted for severe COPD exacerbation severe respiratory failure 6/1-6/3 on vent critically ill severe respiratory distress,held weaning trials 6/3extubated 6/4severe respiratory distress from severe COPD exacerbation placed on BiPAP 6/5 on biPAP, severe resp failure 6/6-6/7 intermittent afib   Review of Systems:  Gen:  Denies  fever, sweats, chills weigh loss  HEENT: Denies blurred vision, double vision, ear pain, eye pain, hearing loss, nose bleeds, sore throat Cardiac:  No dizziness, chest pain or heaviness, chest tightness,edema, No JVD Resp:   No cough, -sputum production, -shortness of breath,+wheezing, -hemoptysis,  Gi: Denies swallowing difficulty, stomach pain, nausea or vomiting, diarrhea, constipation, bowel incontinence Gu:  Denies bladder incontinence, burning urine Ext:   Denies Joint pain, stiffness or swelling Skin: Denies  skin rash, easy bruising or bleeding or hives Endoc:  Denies polyuria, polydipsia , polyphagia or weight change Psych:   Denies depression, insomnia or hallucinations  Other:  All other systems negative   Physical Examination:   GENERAL:NAD, no fevers, chills, no weakness no fatigue HEAD: Normocephalic, atraumatic.  EYES: PERLA, EOMI No scleral icterus.  MOUTH: Moist mucosal membrane.  EAR, NOSE, THROAT: Clear without exudates. No external lesions.  NECK:  Supple. No thyromegaly.  No JVD.  PULMONARY: CTA B/L + wheezing, rhonchi, crackles CARDIOVASCULAR: S1 and S2. Regular rate and rhythm. No murmurs GASTROINTESTINAL: Soft, nontender, nondistended. Positive bowel sounds.  MUSCULOSKELETAL: No swelling, clubbing, or edema.  NEUROLOGIC: No gross focal neurological deficits. 5/5 strength all extremities SKIN: No ulceration, lesions, rashes, or cyanosis.  PSYCHIATRIC: Insight, judgment intact. -depression -anxiety ALL OTHER ROS ARE NEGATIVE       CULTURE RESULTS   Recent Results (from the past 240 hour(s))  Blood culture (routine x 2)     Status: None   Collection Time: 11/08/18 11:38 AM  Result Value Ref Range Status   Specimen Description BLOOD RIGHT ANTECUBITAL  Final   Special Requests   Final    BOTTLES DRAWN AEROBIC AND ANAEROBIC Blood Culture adequate volume   Culture   Final    NO GROWTH 5 DAYS Performed at [facility], [address] [place], [place] [address]    Report Status 11/13/2018 FINAL  Final  Blood culture (routine x 2)     Status: Abnormal   Collection Time: 11/08/18 11:38 AM  Result Value Ref Range Status   Specimen Description   Final    BLOOD LEFT ANTECUBITAL Performed at [facility], [address] [place], [place] [address]    Special Requests   Final    BOTTLES DRAWN AEROBIC AND ANAEROBIC Blood Culture adequate volume Performed at [facility], [address] [place], [place] [address]    Culture  Setup Time   Final    IN BOTH AEROBIC AND ANAEROBIC BOTTLES GRAM POSITIVE COCCI CRITICAL RESULT CALLED TO, READ BACK BY AND VERIFIED  WITH: DAVID BESANTI AT 0326 ON 11/09/2018 JJB    Culture (A)  Final    STAPHYLOCOCCUS SPECIES (COAGULASE NEGATIVE) THE SIGNIFICANCE OF ISOLATING THIS ORGANISM FROM A SINGLE SET OF BLOOD CULTURES WHEN MULTIPLE SETS ARE DRAWN IS UNCERTAIN. PLEASE NOTIFY THE MICROBIOLOGY DEPARTMENT WITHIN ONE WEEK IF SPECIATION AND SENSITIVITIES ARE  REQUIRED. Performed at [facility], [place] [address] [place], [place] [address]    Report Status 11/11/2018 FINAL  Final  Blood Culture ID Panel (Reflexed)     Status: Abnormal   Collection Time: 11/08/18 11:38 AM  Result Value Ref Range Status   Enterococcus species NOT DETECTED NOT DETECTED Final   Listeria monocytogenes NOT DETECTED NOT DETECTED Final   Staphylococcus species DETECTED (A) NOT DETECTED Final    Comment: Methicillin (oxacillin) susceptible coagulase negative staphylococcus. Possible blood culture contaminant (unless isolated from more than one blood culture draw or clinical case suggests pathogenicity). No antibiotic treatment is indicated for blood  culture contaminants. CRITICAL RESULT CALLED TO, READ BACK BY AND VERIFIED WITH: DAVID BESANTI AT 0326 ON 11/09/2018 JJB    Staphylococcus aureus (BCID) NOT DETECTED NOT DETECTED Final   Methicillin resistance NOT DETECTED NOT DETECTED Final   Streptococcus species NOT DETECTED NOT DETECTED Final   Streptococcus agalactiae NOT DETECTED NOT DETECTED Final   Streptococcus pneumoniae NOT DETECTED NOT DETECTED Final   Streptococcus pyogenes NOT DETECTED NOT DETECTED Final   Acinetobacter baumannii NOT DETECTED NOT DETECTED Final   Enterobacteriaceae species NOT DETECTED NOT DETECTED Final   Enterobacter cloacae complex NOT DETECTED NOT DETECTED Final   Escherichia coli NOT DETECTED NOT DETECTED Final   Klebsiella oxytoca NOT DETECTED NOT DETECTED Final   Klebsiella pneumoniae NOT DETECTED NOT DETECTED Final   Proteus species NOT DETECTED NOT DETECTED Final   Serratia marcescens NOT DETECTED NOT DETECTED Final   Haemophilus influenzae NOT DETECTED NOT DETECTED Final   Neisseria meningitidis NOT DETECTED NOT DETECTED Final   Pseudomonas aeruginosa NOT DETECTED NOT DETECTED Final   Candida albicans NOT DETECTED NOT DETECTED Final   Candida glabrata NOT DETECTED NOT DETECTED Final   Candida krusei NOT DETECTED NOT DETECTED  Final   Candida parapsilosis NOT DETECTED NOT DETECTED Final   Candida tropicalis NOT DETECTED NOT DETECTED Final    Comment: Performed at [facility], [address] [place],  [address]  SARS Coronavirus 2 (CEPHEID - Performed in [place] hospital lab), Hosp Order     Status: None   Collection Time: 11/08/18 12:22 PM  Result Value Ref Range Status   SARS Coronavirus 2 NEGATIVE NEGATIVE Final    Comment: (NOTE) If result is NEGATIVE SARS-CoV-2 target nucleic acids are NOT DETECTED. The SARS-CoV-2 RNA is generally detectable in upper and lower  respiratory specimens during the acute phase of infection. The lowest  concentration of SARS-CoV-2 viral copies this assay can detect is 250  copies / mL. A negative result does not preclude SARS-CoV-2 infection  and should not be used as the sole basis for treatment or other  patient management decisions.  A negative result may occur with  improper specimen collection / handling, submission of specimen other  than nasopharyngeal swab, presence of viral mutation(s) within the  areas targeted by this assay, and inadequate number of viral copies  (<250 copies / mL). A negative result must be combined with clinical  observations, patient history, and epidemiological information. If result is POSITIVE SARS-CoV-2 target nucleic acids are DETECTED. The SARS-CoV-2 RNA is generally detectable in  upper and lower  respiratory specimens dur ing the acute phase of infection.  Positive  results are indicative of active infection with SARS-CoV-2.  Clinical  correlation with patient history and other diagnostic information is  necessary to determine patient infection status.  Positive results do  not rule out bacterial infection or co-infection with other viruses. If result is PRESUMPTIVE POSTIVE SARS-CoV-2 nucleic acids MAY BE PRESENT.   A presumptive positive result was obtained on the submitted specimen  and confirmed on repeat  testing.  While 2019 novel coronavirus  (SARS-CoV-2) nucleic acids may be present in the submitted sample  additional confirmatory testing may be necessary for epidemiological  and / or clinical management purposes  to differentiate between  SARS-CoV-2 and other Sarbecovirus currently known to infect humans.  If clinically indicated additional testing with an alternate test  methodology [phone] is advised. The SARS-CoV-2 RNA is generally  detectable in upper and lower respiratory sp ecimens during the acute  phase of infection. The expected result is Negative. Fact Sheet for Patients:  BoilerBrush.com.cyhttps://www.fda.gov/media/136312/download Fact Sheet for Healthcare Providers: https://pope.com/https://www.fda.gov/media/136313/download This test is not yet approved or cleared by the [place] FDA and has been authorized for detection and/or diagnosis of SARS-CoV-2 by FDA under an Emergency Use Authorization (EUA).  This EUA will remain in effect (meaning this test can be used) for the duration of the COVID-19 declaration under Section 564(b)(1) of the Act, 21 U.S.C. section 360bbb-3(b)(1), unless the authorization is terminated or revoked sooner. Performed at [facility], [address] [place], [place] [address]   MRSA PCR Screening     Status: None   Collection Time: 11/08/18  7:39 PM  Result Value Ref Range Status   MRSA by PCR NEGATIVE NEGATIVE Final    Comment:        The GeneXpert MRSA Assay (FDA approved for NASAL specimens only), is one component of a comprehensive MRSA colonization surveillance program. It is not intended to diagnose MRSA infection nor to guide or monitor treatment for MRSA infections. Performed at [facility], [address] [place], [place] [address]          Indwelling Urinary Catheter continued, requirement due to   Reason to continue Indwelling Urinary Catheter strict Intake/Output monitoring for hemodynamic instability   Central Line/  continued, requirement due to  Reason to continue [employer] Line Monitoring of central venous pressure or other hemodynamic parameters and poor IV access        ASSESSMENT AND PLAN SYNOPSIS  68 year old white female with extensive alcohol abuse with severe COPD exacerbation with acute hypoxic and hypercapnic respiratory failure with high   Severe ACUTE Hypoxic and Hypercapnic Respiratory Failure slowly resolving   SEVERE COPD EXACERBATION-slowly resolving -continue IV steroids as prescribed -continue NEB THERAPY as prescribed -morphine as needed -wean fio2 as needed and tolerated   INTERMITTENT AFIB Cardiology consultation for intermittent afib Risk for bleeding with anticoagulation  CARDIAC 2A monitoring  ID -continue IV abx as prescibed -follow up cultures  GI GI PROPHYLAXIS as indicated  NUTRITIONAL STATUS DIET-->as tolerated Constipation protocol as indicated  ENDO - will use ICU hypoglycemic\Hyperglycemia protocol if indicated   ELECTROLYTES -follow labs as needed -replace as needed -pharmacy consultation and following   DVT/GI PRX ordered TRANSFUSIONS AS NEEDED MONITOR FSBS ASSESS the need for LABS as needed    Lucie LeatherKurian David Aroldo Galli, M.D.  [facility]  Medical Director [facility] Medical Director [facility] Cardio-Pulmonary Department

## 2018-11-15 NOTE — Progress Notes (Signed)
Orders given per Patria Mane, NP to advance diet as tolerated. Pt successfully passed bed side swallow screen. No evidence of choking or aspiration observed.

## 2018-11-16 LAB — BASIC METABOLIC PANEL
Anion gap: 7 (ref 5–15)
BUN: 23 mg/dL (ref 8–23)
CO2: 35 mmol/L — ABNORMAL HIGH (ref 22–32)
Calcium: 8.8 mg/dL — ABNORMAL LOW (ref 8.9–10.3)
Chloride: 95 mmol/L — ABNORMAL LOW (ref 98–111)
Creatinine, Ser: 0.64 mg/dL (ref 0.44–1.00)
GFR calc Af Amer: >60 mL/min (ref >60)
GFR calc non Af Amer: >60 mL/min (ref >60)
Glucose, Bld: 113 mg/dL — ABNORMAL HIGH (ref 70–99)
Potassium: 3.5 mmol/L (ref 3.5–5.1)
Sodium: 137 mmol/L (ref 135–145)

## 2018-11-16 LAB — PHOSPHORUS: Phosphorus: 4 mg/dL (ref 2.5–4.6)

## 2018-11-16 LAB — MAGNESIUM: Magnesium: 2.1 mg/dL (ref 1.7–2.4)

## 2018-11-16 MED ORDER — ENSURE ENLIVE PO LIQD
237.0000 mL | Freq: Two times a day (BID) | ORAL | Status: DC
  Administered 2018-11-16 – 2018-11-17 (×2): 237 mL via ORAL

## 2018-11-16 MED ORDER — METHYLPREDNISOLONE SODIUM SUCC 40 MG IJ SOLR
40.0000 mg | Freq: Two times a day (BID) | INTRAMUSCULAR | Status: DC
  Administered 2018-11-16 – 2018-11-17 (×2): 40 mg via INTRAVENOUS
  Filled 2018-11-16 (×2): qty 1

## 2018-11-16 MED ORDER — SODIUM CHLORIDE 0.9% FLUSH: 3.0000 mL | INTRAVENOUS | Status: DC | PRN

## 2018-11-16 MED ORDER — POLYETHYLENE GLYCOL 3350 17 G PO PACK
17.0000 g | PACK | Freq: Every day | ORAL | Status: DC
  Administered 2018-11-16 – 2018-11-17 (×2): 17 g via ORAL
  Filled 2018-11-16 (×2): qty 1

## 2018-11-16 MED ORDER — SODIUM CHLORIDE 0.9% FLUSH
3.0000 mL | Freq: Two times a day (BID) | INTRAVENOUS | Status: DC
  Administered 2018-11-16 (×2): 3 mL via INTRAVENOUS

## 2018-11-16 NOTE — Progress Notes (Signed)
[place] [facility] [place] NAME: Angela Berry    MR#:  [id number]  DATE OF BIRTH:  1950/07/15  SUBJECTIVE:   Patient is doing much better respiratory wise.  Denies cough, fever, wheezing.  REVIEW OF SYSTEMS:    Review of Systems  Constitutional: Negative for fever, chills weight loss HENT: Negative for ear pain, nosebleeds, congestion, facial swelling, rhinorrhea, neck pain, neck stiffness and ear discharge.   Respiratory: Negative for cough, shortness of breath, wheezing  Cardiovascular: Negative for chest pain, palpitations and leg swelling.  Gastrointestinal: Negative for heartburn, abdominal pain, vomiting, diarrhea or consitpation Genitourinary: Negative for dysuria, urgency, frequency, hematuria Musculoskeletal: Negative for back pain or joint pain Neurological: Negative for dizziness, seizures, syncope, focal weakness,  numbness and headaches.  Hematological: Does not bruise/bleed easily.  Psychiatric/Behavioral: Negative for hallucinations, confusion, dysphoric mood    Tolerating Diet: yes      DRUG ALLERGIES:  No Known Allergies  VITALS:  Blood pressure (!) 171/98, pulse 98, temperature 98 F (36.7 C), temperature source Oral, resp. rate 16, height 5' 2.52" (1.588 m), weight 74.9 kg, SpO2 94 %.  PHYSICAL EXAMINATION:  Constitutional: Appears well-developed and well-nourished. No distress. HENT: Normocephalic. Marland Kitchen Oropharynx is clear and moist.  Eyes: Conjunctivae and EOM are normal. PERRLA, no scleral icterus.  Neck: Normal ROM. Neck supple. No JVD. No tracheal deviation. CVS: RRR, S1/S2 +, no murmurs, no gallops, no carotid bruit.  Pulmonary: Effort and breath sounds normal, no stridor, rhonchi, wheezes, rales.  Abdominal: Soft. BS +,  no distension, tenderness, rebound or guarding.  Musculoskeletal: Normal range of motion. No edema and no tenderness.  Neuro: Alert. CN 2-12 grossly intact. No focal deficits. Skin: Skin is  warm and dry. No rash noted. Psychiatric: Normal mood and affect.      LABORATORY PANEL:   CBC Recent Labs  Lab 11/14/18 0232  WBC 7.9  HGB 13.9  HCT 45.6  PLT 201   ------------------------------------------------------------------------------------------------------------------  Chemistries  Recent Labs  Lab 11/14/18 0232  11/16/18 0438  NA 144   < > 137  K 4.6   < > 3.5  CL 95*   < > 95*  CO2 36*   < > 35*  GLUCOSE 123*   < > 113*  BUN 28*   < > 23  CREATININE 0.67   < > 0.64  CALCIUM 9.1   < > 8.8*  MG 2.4   < > 2.1  AST 49*  --   --   ALT 302*  --   --   ALKPHOS 53  --   --   BILITOT 1.6*  --   --    < > = values in this interval not displayed.   ------------------------------------------------------------------------------------------------------------------  Cardiac Enzymes Recent Labs  Lab 11/14/18 0736 11/14/18 1319 11/15/18 0324  TROPONINI <0.03 <0.03 <0.03   ------------------------------------------------------------------------------------------------------------------  RADIOLOGY:  No results found.   ASSESSMENT AND PLAN:   68 year old female with a history of COPD who presented to the ER due to shortness of breath.  1.  Acute hypoxic respiratory failure/hypercapnic respiratory failure due to COPD exacerbation and pneumonia with septic shock: Patient was intubated in the emergency room on admission.  She has been extubated since June 4. Oxygen is being weaned as tolerated. Septic shock has resolved.  She has been off pressors. Wean steroids  2.  Aspiration pneumonia: Continue Unasyn for 2 more days.  3. Hyperkalemia: This is resolved.  4.  Acute metabolic  encephalopathy: CT scan of the head is normal.  Patient symptoms have resolved and she is at baseline.  5.  Acute kidney injury: This is resolved with IV fluids  6.  Shock liver due to septic shock: LFTs have improved.  Management plans discussed with the patient and she is in  agreement.  CODE STATUS: full  TOTAL TIME TAKING CARE OF THIS PATIENT: 27 minutes.     POSSIBLE D/C tomorrow SNF, DEPENDING ON CLINICAL CONDITION.   Adrian SaranSital Janila Arrazola M.D on 11/16/2018 at 1:24 PM  Between 7am to 6pm - Pager - [phone] After 6pm go to [email or web address] - password [email or web address]  [facility] [place] [facility]  Office  [phone]  CC: Primary care physician; Martie RoundSpencer, Nicole, NP  Note: This dictation was prepared with Dragon dictation along with smaller phrase technology. Any transcriptional errors that result from this process are unintentional.

## 2018-11-16 NOTE — Progress Notes (Signed)
Patient placed on low bed with mats.

## 2018-11-16 NOTE — Progress Notes (Signed)
Patient declined Bipap at this time. Patient resting comfortably on 2L [place], no distress noted. Wishes to stay off bipap if able, will call RN and RT if she changes her mind. Will continue to monitor.

## 2018-11-16 NOTE — NC FL2 (Signed)
[place] LEVEL OF CARE SCREENING TOOL     IDENTIFICATION  Patient Name: Angela Berry Birthdate: May 10, 1951 Sex: female Admission Date (Current Location): 11/08/2018  [place] and [place] Number:  Engineering geologist and Address:  [facility], [address] [place], [place] [address]      Provider Number: [id number]  Attending Physician Name and Address:  Bettey Costa, MD  Relative Name and Phone Number:  Dianna Limbo  [phone]     Current Level of Care: Hospital Recommended Level of Care: [place] Prior Approval Number:    Date Approved/Denied:   PASRR Number: [id number]  Discharge Plan: SNF    Current Diagnoses: Patient Active Problem List   Diagnosis Date Noted  . Acute hypercapnic respiratory failure ([place]) 11/08/2018  . Pulmonary emphysema ([place]) 04/28/2017  . Mixed incontinence urge and stress 10/20/2015    Orientation RESPIRATION BLADDER Height & Weight     Self, Time, Situation, Place  O2(4L acute) Continent Weight: 74.9 kg Height:  5' 2.52" (158.8 cm)  BEHAVIORAL SYMPTOMS/MOOD NEUROLOGICAL BOWEL NUTRITION STATUS      Continent Diet(heart healthy)  AMBULATORY STATUS COMMUNICATION OF NEEDS Skin   Limited Assist Verbally Normal                       Personal Care Assistance Level of Assistance  Bathing, Feeding, Dressing Bathing Assistance: Limited assistance Feeding assistance: Independent Dressing Assistance: Limited assistance     Functional Limitations Info  Sight, Hearing, Speech Sight Info: Adequate Hearing Info: Adequate Speech Info: Adequate    SPECIAL CARE FACTORS FREQUENCY  PT (By licensed PT)     PT Frequency: 5 X week              Contractures Contractures Info: Not present    Additional Factors Info  Code Status, Allergies Code Status Info: Full Allergies Info: NKA           Current Medications (11/16/2018):  This is the current hospital active  medication list Current Facility-Administered Medications  Medication Dose Route Frequency Provider Last Rate Last Dose  . 0.9 %  sodium chloride infusion   Intravenous PRN Flora Lipps, MD 10 mL/hr at 11/16/18 0851 250 mL at 11/16/18 0851  . amLODipine (NORVASC) tablet 5 mg  5 mg Oral Daily Tukov-Yual, Magdalene S, NP   5 mg at 11/15/18 0945  . Ampicillin-Sulbactam (UNASYN) 3 g in sodium chloride 0.9 % 100 mL IVPB  3 g Intravenous Q6H Charlett Nose, RPH 200 mL/hr at 11/16/18 0855 3 g at 11/16/18 0855  . bisacodyl (DULCOLAX) suppository 10 mg  10 mg Rectal Daily PRN Awilda Bill, NP   10 mg at 11/12/18 2135  . budesonide (PULMICORT) nebulizer solution 0.5 mg  0.5 mg Nebulization BID Flora Lipps, MD   0.5 mg at 11/15/18 0714  . Chlorhexidine Gluconate Cloth 2 % PADS 6 each  6 each Topical Daily Tukov-Yual, Magdalene S, NP   6 each at 78/46/96 2952  . folic acid (FOLVITE) tablet 1 mg  1 mg Oral Daily Flora Lipps, MD   1 mg at 11/15/18 0945  . guaiFENesin-dextromethorphan (ROBITUSSIN DM) 100-10 MG/5ML syrup 5 mL  5 mL Oral Q4H PRN Flora Lipps, MD   5 mL at 11/14/18 1712  . ipratropium-albuterol (DUONEB) 0.5-2.5 (3) MG/3ML nebulizer solution 3 mL  3 mL Nebulization Q6H PRN Awilda Bill, NP      . ipratropium-albuterol (DUONEB) 0.5-2.5 (3) MG/3ML nebulizer  solution 3 mL  3 mL Nebulization TID Erin FullingKasa, Kurian, MD      . labetalol (NORMODYNE) injection 5-10 mg  5-10 mg Intravenous Q2H PRN Tukov-Yual, Magdalene S, NP   10 mg at 11/15/18 0616  . lisinopril (ZESTRIL) tablet 40 mg  40 mg Oral Daily Tukov-Yual, Magdalene S, NP   40 mg at 11/15/18 0945  . LORazepam (ATIVAN) injection 1-2 mg  1-2 mg Intravenous Q4H PRN Harlon DittyKeene, Jeremiah D, NP   1 mg at 11/15/18 0132  . methylPREDNISolone sodium succinate (SOLU-MEDROL) 40 mg/mL injection 40 mg  40 mg Intravenous Q12H Mody, Sital, MD      . metoprolol succinate (TOPROL-XL) 24 hr tablet 50 mg  50 mg Oral Daily Erin FullingKasa, Kurian, MD   50 mg at 11/15/18 1446  .  multivitamin with minerals tablet 1 tablet  1 tablet Oral Daily Erin FullingKasa, Kurian, MD   1 tablet at 11/15/18 0945  . pantoprazole (PROTONIX) EC tablet 40 mg  40 mg Oral BID Erin FullingKasa, Kurian, MD   40 mg at 11/15/18 2154  . sodium chloride flush (NS) 0.9 % injection 3 mL  3 mL Intravenous Q12H Mody, Sital, MD      . sodium chloride flush (NS) 0.9 % injection 3 mL  3 mL Intravenous PRN Mody, Sital, MD      . thiamine (VITAMIN B-1) tablet 100 mg  100 mg Oral Daily Erin FullingKasa, Kurian, MD   100 mg at 11/15/18 0945  . traZODone (DESYREL) tablet 100 mg  100 mg Oral QHS Seals, Angela H, NP   100 mg at 11/15/18 2154     Discharge Medications: Please see discharge summary for a list of discharge medications.  Relevant Imaging Results:  Relevant Lab Results:   Additional Information [id number]  Sherren KernsJennifer L Burgess Sheriff, RN

## 2018-11-16 NOTE — TOC Progression Note (Signed)
Transition of Care [facility]) - Progression Note    Patient Details  Name: Angela Berry MRN: [id number] Date of Birth: [birth year]  Transition of Care [facility]) CM/SW Contact  Elza Rafter, RN Phone Number: 11/16/2018, 2:03 PM  Clinical Narrative:   Holland Falling is out of network with [facility] which was patients first choice.      Expected Discharge Plan: [place] Barriers to Discharge: Continued Medical Work up  Expected Discharge Plan and Services Expected Discharge Plan: [place]   Discharge Planning Services: CM Consult Post Acute Care Choice: [place] Living arrangements for the past 2 months: Single Family Home                                       Social Determinants of Health (SDOH) Interventions    Readmission Risk Interventions Readmission Risk Prevention Plan 11/16/2018  Transportation Screening Complete  PCP or Specialist Appt within 5-7 Days Complete  Home Care Screening Complete  Medication Review (RN CM) Complete  Some recent data might be hidden

## 2018-11-16 NOTE — Care Management Note (Signed)
Case Management Note  Patient Details  Name: Angela Berry MRN: [id number] Date of Birth: 1950/08/26  Subjective/Objective:      Severe COPD and end stage emphysema; chronic respiratory failure with hypoxemia.  Patient continues to exhibit signs of hypercapnia associated with chronic respiratory failure secondary to severe COPD.  Patient requires the use of NIV both QHS and daytime to help with exacerbation periods.  The use of NIV will treat the patient's high PCO2 levels and can reduce risk of exacerbations and future hospitalizations when used at night and during the day.  Patient will need these advanced settings in conjunction with his current medication regimen; BIPAP with backup rate has been tried and failed.  Failure to have NIV available for use over a 24 hr period could lead to death.  Patient has been able to protect their airway and clear their own secretions.Angela Berry, [facility]  [phone]                Action/Plan:   Expected Discharge Date:                  Expected Discharge Plan:  [place]  In-House Referral:     Discharge planning Services  CM Consult  Post Acute Care Choice:  Skilled Nursing Facility Choice offered to:  Patient  DME Arranged:    DME Agency:     HH Arranged:    [place] Agency:     Status of Service:     If discussed at [employer] of Stay Meetings, dates discussed:    Additional Comments:  Elza Rafter, RN 11/16/2018, 1:51 PM

## 2018-11-16 NOTE — Consult Note (Signed)
PHARMACY CONSULT NOTE - FOLLOW UP  Pharmacy Consult for Electrolyte Monitoring and Replacement   Recent Labs: Potassium (mmol/L)  Date Value  11/16/2018 3.5  08/30/2012 3.8   Magnesium (mg/dL)  Date Value  11/16/2018 2.1   Calcium (mg/dL)  Date Value  11/16/2018 8.8 (L)   Calcium, Total (mg/dL)  Date Value  08/30/2012 8.9   Albumin (g/dL)  Date Value  11/14/2018 3.2 (L)  08/30/2012 3.4   Phosphorus (mg/dL)  Date Value  11/16/2018 4.0   Sodium (mmol/L)  Date Value  11/16/2018 137  08/30/2012 132 (L)  Corrected Ca: 9.44  Assessment: 68 y.o. female with a history of COPD and asthma who was brought into the ED by EMS with report of passing out after drinking some wine the night before. She has noted to have electrolyte abnormalities.  Pt w/ aspiration PNA, ETOH abuse  Goal of Therapy:  K ~ 4, magnesium ~2, phosphorous 2.5-4.6, calcium 8-10  Plan:  K 3.5  Mag 2.1  Phos 4.0  Scr 0.64 --No further electrolyte supplementation for today --F/U labs in the am and replace as needed  Rowland Lathe ,PharmD Clinical Pharmacist 11/16/2018 8:10 AM

## 2018-11-16 NOTE — Progress Notes (Signed)
Nutrition Follow Up Note   DOCUMENTATION CODES:   Not applicable  INTERVENTION:   Ensure Enlive po BID, each supplement provides 350 kcal and 20 grams of protein  MVI daily   Liberalize diet   NUTRITION DIAGNOSIS:   Inadequate oral intake related to inability to eat as evidenced by NPO status. -pt advanced to regular diet   GOAL:   Patient will meet greater than or equal to 90% of their needs  -progressing   MONITOR:   PO intake, Supplement acceptance, Labs, Weight trends, Skin, I & O's  ASSESSMENT:   68 year old female with PMHx of COPD, asthma admitted with acute hypercapnic respiratory failure requiring intubation on 5/31, hyperkalemia, septic shock, acute metabolic encephalopathy, AKI.   Pt advanced to heart healthy diet 6/7; pt currently eating <50% of meals. RD will add supplements and liberalize diet as pt not likely eating enough to exceed nutrient limits. Per chart, pt fairly weight stable since admit.   Medications reviewed and include: folic acid, solu-medrol, MVI, protonix, thiamine, unasyn   Labs reviewed: K 3.5 wnl, P 4.0 wnl, Mg 2.1 wnl  Diet Order:   Diet Order            Diet regular Room service appropriate? Yes; Fluid consistency: Thin  Diet effective now             EDUCATION NEEDS:   No education needs have been identified at this time  Skin:  Skin Assessment: Reviewed RN Assessment  Last BM:  6/5  Height:   Ht Readings from Last 1 Encounters:  11/09/18 5' 2.52" (1.588 m)   Weight:   Wt Readings from Last 1 Encounters:  11/16/18 74.9 kg   Ideal Body Weight:  51.2 kg  BMI:  Body mass index is 29.7 kg/m.  Estimated Nutritional Needs:   Kcal:  1600-1800kcal/day   Protein:  80-90g/day   Fluid:  >1.5L/day   Koleen Distance MS, RD, LDN Pager #- [phone] Office#- [phone] After Hours Pager: [phone]

## 2018-11-16 NOTE — Care Management Important Message (Signed)
Important Message  Patient Details  Name: Angela Berry MRN: [id number] Date of Birth: 1950-10-22   Medicare Important Message Given:  Yes    Dannette Barbara 11/16/2018, 11:45 AM

## 2018-11-16 NOTE — Progress Notes (Signed)
Physical Therapy Treatment Patient Details Name: Angela MorganSusan E Berry MRN: [id number] DOB: 04-Jun-1951 Today's Date: 11/16/2018    History of Present Illness 68 year old female with history of asthma and COPD being admitted for management of acute hypercapnic respiratory failure.  Pt needed to be intubated for 3 days, on 3L O2 at time of PT exam.    PT Comments    Pt is making gradual progress towards goals, however per chart review, pt had unwitnessed fall this date, no injuries. Pt received in recliner upon arrival. B LE weakness observed with knee lag during there-ex. Once standing, pt with buckling noted. Needs cues for upright posture and recovery. Fatigues quickly with ambulation. Sats decrease with mobility, cues for pursed lip breathing. Will continue to progress.   Follow Up Recommendations  SNF     Equipment Recommendations  Rolling walker with 5" wheels    Recommendations for Other Services       Precautions / Restrictions Precautions Precautions: Fall Restrictions Weight Bearing Restrictions: No    Mobility  Bed Mobility               General bed mobility comments: pt in recliner upon arrival  Transfers Overall transfer level: Needs assistance Equipment used: Rolling walker (2 wheeled) Transfers: Sit to/from Stand Sit to Stand: Min assist         General transfer comment: Upon standing, posterior LOB with min assist to self correct. Slight buckling noted once standing, heavy B UE reliance on RW.  Ambulation/Gait Ambulation/Gait assistance: Min assist Gait Distance (Feet): 40 Feet Assistive device: Rolling walker (2 wheeled) Gait Pattern/deviations: Step-through pattern     General Gait Details: ambulated with slow step through gait pattern with multiple LOB requiring min assist for self correction. Small BOS during ambulation. Heavy cues for obstacle avoidance during room ambulation. Fatigues quickly with endurance. Sats decrease to 84% with ambulation  with cues for pursed lip breathing with sats improving to 91% quickly, 4L of O2.   Stairs             Wheelchair Mobility    Modified Rankin (Stroke Patients Only)       Balance Overall balance assessment: Needs assistance;History of Falls Sitting-balance support: Feet supported;No upper extremity supported Sitting balance-Leahy Scale: Good     Standing balance support: Bilateral upper extremity supported Standing balance-Leahy Scale: Fair                              Cognition Arousal/Alertness: Awake/alert Behavior During Therapy: WFL for tasks assessed/performed Overall Cognitive Status: Within Functional Limits for tasks assessed                                        Exercises Other Exercises Other Exercises: Seated ther-ex performed on B LE including AP, heel slides, SAQ, hip add squeezes, SLR, and hip abd/add. Once standing, able to perform alt marching using RW. 12 reps with cga on R leg and min assist on L Leg. Sats decreased to 83% on 3L of O2, returned O2 to 4L.    General Comments        Pertinent Vitals/Pain Pain Assessment: No/denies pain    Home Living                      Prior Function  PT Goals (current goals can now be found in the care plan section) Acute Rehab PT Goals Patient Stated Goal: go to rehab PT Goal Formulation: With patient Time For Goal Achievement: 11/29/18 Potential to Achieve Goals: Good Progress towards PT goals: Progressing toward goals    Frequency    Min 2X/week      PT Plan Current plan remains appropriate    Co-evaluation              AM-PAC PT "6 Clicks" Mobility   Outcome Measure  Help needed turning from your back to your side while in a flat bed without using bedrails?: None Help needed moving from lying on your back to sitting on the side of a flat bed without using bedrails?: None Help needed moving to and from a bed to a chair (including a  wheelchair)?: A Little Help needed standing up from a chair using your arms (e.g., wheelchair or bedside chair)?: A Little Help needed to walk in hospital room?: A Little Help needed climbing 3-5 steps with a railing? : A Lot 6 Click Score: 19    End of Session Equipment Utilized During Treatment: Gait belt;Oxygen Activity Tolerance: Patient tolerated treatment well Patient left: in chair;with chair alarm set;with nursing/sitter in room Nurse Communication: Mobility status PT Visit Diagnosis: Muscle weakness (generalized) (M62.81);Difficulty in walking, not elsewhere classified (R26.2)     Time: 7494-4967 PT Time Calculation (min) (ACUTE ONLY): 29 min  Charges:  $Gait Training: 8-22 mins $Therapeutic Exercise: 8-22 mins                     Angela Berry, PT, DPT [phone]    Angela Berry 11/16/2018, 12:52 PM

## 2018-11-16 NOTE — Progress Notes (Signed)
   11/16/18 0831  What Happened  Was fall witnessed? No  Was patient injured? Unsure  Patient found on floor  Found by Staff-comment (Respiratory Therapist)  Stated prior activity ambulating-unassisted  Follow Up  MD notified Bettey Costa, MD  Time MD notified [phone]  Family notified No- patient refusal (patient A&O x4)  Adult Fall Risk Assessment  Risk Factor Category (scoring not indicated) Fall has occurred during this admission (document High fall risk)  Patient Fall Risk Level High fall risk  Adult Fall Risk Interventions  Required Bundle Interventions *See Row Information* High fall risk - low, moderate, and high requirements implemented  Additional Interventions Use of appropriate toileting equipment (bedpan, BSC, etc.)  Screening for Fall Injury Risk (To be completed on HIGH fall risk patients) - Assessing Need for Low Bed  Risk For Fall Injury- Low Bed Criteria Previous fall this admission  Will Implement Low Bed and Floor Mats Yes  Screening for Fall Injury Risk (To be completed on HIGH fall risk patients who do not meet crieteria for Low Bed) - Assessing Need for Floor Mats Only  Risk For Fall Injury- Criteria for Floor Mats None identified - No additional interventions needed  Pain Assessment  Pain Scale 0-10  Pain Score 0  Neurological  Neuro (WDL) WDL  Level of Consciousness Alert  Orientation Level Oriented X4  Cognition Appropriate at baseline  Speech Clear  Musculoskeletal  Musculoskeletal (WDL) X  Assistive Device Front wheel walker  Generalized Weakness Yes  Integumentary  Integumentary (WDL) X  Skin Color Appropriate for ethnicity  Skin Condition Dry  Skin Integrity Ecchymosis  Ecchymosis Location Arm;Buttocks (scattered on arm, small right side buttocks)  Ecchymosis Location Orientation Bilateral;Right (arms, right buttocks)  Ecchymosis Intervention Other (Comment) (assessed, no pain at this time)  Skin Turgor Non-tenting    Patient fell  approximately 0805. States she was trying to get out of bed to get a comb to brush her hair. She was trying to stand and slid off the edge of the bed from the sheets sliding. Patient does not complain of pain at this time. Small bruise noted on right side of buttocks. Bettey Costa, MD notified at [phone]. Will continue to assess and monitor for pain. Low bed & floor mats have been ordered.

## 2018-11-16 NOTE — TOC Initial Note (Signed)
Transition of Care [facility]) - Initial/Assessment Note    Patient Details  Name: Angela Berry MRN: [id number] Date of Birth: 1950-08-02  Transition of Care [facility]) CM/SW Contact:    Elza Rafter, RN Phone Number: 11/16/2018, 11:46 AM  Clinical Narrative:   Patient is from home.  Elderly mother lives with her.  Admitted with acute hypercapnic respiratory failure.  She passed out and doesn't remember how long she was out.  She is currently on 4L acute O2.  States she does not have oxygen at home but is in the process of purchasing a small portable machine.  Explained if she has qualifying O2 saturations we can assist her in obtaining oxygen through [employer] with her medicare.   She has a cane at home if needed.  She still drives.  Current with her PCP at [facility] but would like to switch to [facility]. Obtains medications at [facility].  Patient fell this morning.  She is very weak from being down for an unknown amount of time.  PT has recommended SNF.  Obtained PASRR and completed FL@-sent out for bed requests.  She would prefer [facility]  [employer] Medicare which could take a few days for authorization.               Expected Discharge Plan: Skilled Nursing Facility Barriers to Discharge: Continued Medical Work up   Patient Goals and CMS Choice Patient states their goals for this hospitalization and ongoing recovery are:: would like to DC to STR at [place] offered to / list presented to : Patient  Expected Discharge Plan and Services Expected Discharge Plan: [place]   Discharge Planning Services: CM Consult Post Acute Care Choice: [place] Living arrangements for the past 2 months: Single Family Home                                      Prior Living Arrangements/Services Living arrangements for the past 2 months: Single Family Home Lives with:: (elderly mother) Patient language and need for interpreter  reviewed:: Yes        Need for Family Participation in Patient Care: Yes (Comment)(COPD) Care giver support system in place?: Yes (comment)   Criminal Activity/Legal Involvement Pertinent to Current Situation/Hospitalization: No - Comment as needed  Activities of Daily Living Home Assistive Devices/Equipment: None ADL Screening (condition at time of admission) Patient's cognitive ability adequate to safely complete daily activities?: No Is the patient deaf or have difficulty hearing?: No Does the patient have difficulty seeing, even when wearing glasses/contacts?: No Does the patient have difficulty concentrating, remembering, or making decisions?: No Patient able to express need for assistance with ADLs?: No Does the patient have difficulty dressing or bathing?: No Independently performs ADLs?: No Does the patient have difficulty walking or climbing stairs?: Yes Weakness of Legs: Both Weakness of Arms/Hands: Both  Permission Sought/Granted Permission sought to share information with : Facility Art therapist granted to share information with : Yes, Verbal Permission Granted     Permission granted to share info w AGENCY: SNF        Emotional Assessment Appearance:: Appears stated age Attitude/Demeanor/Rapport: Gracious Affect (typically observed): Accepting Orientation: : Oriented to Self, Oriented to Place, Oriented to  Time, Oriented to Situation Alcohol / Substance Use: Other (comment)(cannabinoid) Psych Involvement: No (comment)  Admission diagnosis:  Acidosis [E87.2] Hypoxia [R09.02] Hypothermia,  initial encounter [T68.XXXA] Hypotension, unspecified hypotension type [I95.9] Sepsis, due to unspecified organism, unspecified whether acute organ dysfunction present [facility](HCC) [A41.9] Patient Active Problem List   Diagnosis Date Noted  . Acute hypercapnic respiratory failure (HCC) 11/08/2018  . Pulmonary emphysema (HCC) 04/28/2017  . 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   PCP:  Martie RoundSpencer, Nicole, NP [facility] [place] [address] [place] [address] Phone: [phone] Fax: [phone]     Social Determinants of Health (SDOH) Interventions    Readmission Risk Interventions Readmission Risk Prevention Plan 11/16/2018  Transportation Screening Complete  PCP or Specialist Appt within 5-7 Days Complete  Home Care Screening Complete  Medication Review (RN CM) Complete  Some recent data might be hidden

## 2018-11-16 NOTE — Progress Notes (Signed)
94% on 2L [place]., 86% on RA.  Returned to 2L [place].

## 2018-11-16 NOTE — Progress Notes (Signed)
Entered room to administer SVN tx. Patient on floor on knees beside the bed.Notified nursing to assist with returning her to bed.Does not voice any complaints

## 2018-11-16 NOTE — Progress Notes (Signed)
  Speech Language Pathology Treatment: Dysphagia  Patient Details Name: Angela Berry MRN: [id number] DOB: 02-27-1951 Today's Date: 11/16/2018 Time: 2094-7096 SLP Time Calculation (min) (ACUTE ONLY): 25 min  Assessment / Plan / Recommendation Clinical Impression  Pt visited for dysphagia tx to determine safety of current heart healthy diet. Previous bedside swallow eval revealeld high risk for aspiration. Today, Pt was alert and pleasant. Pt was given sips of water, bites of applesauce and bites of a graham cracker. Pt tolerated all consistencies without s/s of aspiration. Vocal quality remained clear, laryngeal elevation appeared adequate. Pt was able to clear oral cavity of all boluses after the swallow. Rec continue with current diet. Nsg or MD to notify ST of any dysphagia.  HPI HPI: bedside swallow eval 6/6 with rec for NPo except ice chips. Pt has since shown overall improvement and has been placed on a heart healthy diet.      SLP Plan  Discharge SLP treatment due to (comment)       Recommendations  Diet recommendations: Regular Liquids provided via: Cup;Straw Medication Administration: Whole meds with liquid Supervision: Patient able to self feed Postural Changes and/or Swallow Maneuvers: Seated upright 90 degrees                Oral Care Recommendations: Patient independent with oral care SLP Visit Diagnosis: Dysphagia, oropharyngeal phase (R13.12) Plan: Discharge SLP treatment due to (comment)       GO                Lucila Maine 11/16/2018, 11:26 AM

## 2018-11-17 LAB — BASIC METABOLIC PANEL
Anion gap: 8 (ref 5–15)
BUN: 24 mg/dL — ABNORMAL HIGH (ref 8–23)
CO2: 34 mmol/L — ABNORMAL HIGH (ref 22–32)
Calcium: 9.1 mg/dL (ref 8.9–10.3)
Chloride: 95 mmol/L — ABNORMAL LOW (ref 98–111)
Creatinine, Ser: 0.69 mg/dL (ref 0.44–1.00)
GFR calc Af Amer: >60 mL/min (ref >60)
GFR calc non Af Amer: >60 mL/min (ref >60)
Glucose, Bld: 106 mg/dL — ABNORMAL HIGH (ref 70–99)
Potassium: 4.3 mmol/L (ref 3.5–5.1)
Sodium: 137 mmol/L (ref 135–145)

## 2018-11-17 LAB — MAGNESIUM: Magnesium: 2.1 mg/dL (ref 1.7–2.4)

## 2018-11-17 LAB — PHOSPHORUS: Phosphorus: 4.2 mg/dL (ref 2.5–4.6)

## 2018-11-17 MED ORDER — AMOXICILLIN-POT CLAVULANATE 875-125 MG PO TABS: 1.0000 | ORAL_TABLET | Freq: Two times a day (BID) | ORAL | 0 refills | Status: AC

## 2018-11-17 MED ORDER — ACETAMINOPHEN 325 MG PO TABS
650.0000 mg | ORAL_TABLET | Freq: Four times a day (QID) | ORAL | Status: DC | PRN
  Administered 2018-11-17: 650 mg via ORAL
  Filled 2018-11-17: qty 2

## 2018-11-17 MED ORDER — PREDNISONE 10 MG PO TABS: 10.0000 mg | ORAL_TABLET | Freq: Every day | ORAL | 0 refills | Status: AC

## 2018-11-17 MED ORDER — IPRATROPIUM-ALBUTEROL 0.5-2.5 (3) MG/3ML IN SOLN: 3.0000 mL | Freq: Four times a day (QID) | RESPIRATORY_TRACT | 0 refills | Status: AC | PRN

## 2018-11-17 NOTE — Consult Note (Signed)
PHARMACY CONSULT NOTE - FOLLOW UP  Pharmacy Consult for Electrolyte Monitoring and Replacement   Recent Labs: Potassium (mmol/L)  Date Value  11/17/2018 4.3  08/30/2012 3.8   Magnesium (mg/dL)  Date Value  11/17/2018 2.1   Calcium (mg/dL)  Date Value  11/17/2018 9.1   Calcium, Total (mg/dL)  Date Value  08/30/2012 8.9   Albumin (g/dL)  Date Value  11/14/2018 3.2 (L)  08/30/2012 3.4   Phosphorus (mg/dL)  Date Value  11/17/2018 4.2   Sodium (mmol/L)  Date Value  11/17/2018 137  08/30/2012 132 (L)  Corrected Ca: 9.44  Assessment: 68 y.o. female with a history of COPD and asthma who was brought into the ED by EMS with report of passing out after drinking some wine the night before. She has noted to have electrolyte abnormalities.  Pt w/ aspiration PNA, ETOH abuse  Goal of Therapy:  K ~ 4, magnesium ~2, phosphorous 2.5-4.6, calcium 8-10  Plan:  K 4.3  Mag 2.1  Phos 4.2  Scr 0.69 --No further electrolyte supplementation for today --F/U labs in the am and replace as needed  Rowland Lathe ,PharmD Clinical Pharmacist 11/17/2018 10:07 AM

## 2018-11-17 NOTE — Discharge Summary (Signed)
[facility] - [place] [facility]   PATIENT NAME: Angela Berry    MR#:  [id number]  DATE OF BIRTH:  January 14, 1951  DATE OF ADMISSION:  11/08/2018 ADMITTING PHYSICIAN: Jama FlavorsJude Ojie, MD  DATE OF DISCHARGE: 11/17/2018   PRIMARY CARE PHYSICIAN: Martie RoundSpencer, Nicole, NP    ADMISSION DIAGNOSIS:  Acidosis [E87.2] Hypoxia [R09.02] Hypothermia, initial encounter [T68.XXXA] Hypotension, unspecified hypotension type [I95.9] Sepsis, due to unspecified organism, unspecified whether acute organ dysfunction present (HCC) [A41.9]  DISCHARGE DIAGNOSIS:  Active Problems:   Acute hypercapnic respiratory failure (HCC) on chronic resp failure from COPD   SECONDARY DIAGNOSIS:   Past Medical History:  Diagnosis Date  . Asthma   . COPD (chronic obstructive pulmonary disease) (HCC)   . Dyspnea     HOSPITAL COURSE:  68 year old female with a history of COPD who presented to the ER due to shortness of breath.  1.  Acute on chronic hypoxic respiratory failure/hypercapnic respiratory failure due to severe COPD exacerbation and pneumonia with septic shock: Patient was intubated in the emergency room on admission.  She has been extubated since June 4. She will require O2. Septic shock has resolved.  She is off of pressors. She will be discharged on a steroid taper and Nebulizer PRN treatments.  2.  Aspiration pneumonia: Her symptoms have improved.She will complete course of treatment with oral Augmentin upon discharge.\\  3. Hyperkalemia: This has resolved.  4.  Acute metabolic encephalopathy in the setting of COPD exacerbation with hypoxia and septic shock: CT scan of the head is normal.  Patient symptoms have resolved and she is at baseline.  5.  Acute kidney injury: This is resolved with IV fluids  6.  Shock liver due to septic shock: LFTs have improved   DISCHARGE CONDITIONS AND DIET:   Stable for discharge on regular diet  CONSULTS OBTAINED:    DRUG ALLERGIES:  No Known  Allergies  DISCHARGE MEDICATIONS:   Allergies as of 11/17/2018   No Known Allergies     Medication List    TAKE these medications   amLODipine 5 MG tablet Commonly known as:  NORVASC Take 5 mg by mouth daily.   amoxicillin-clavulanate 875-125 MG tablet Commonly known as:  Augmentin Take 1 tablet by mouth 2 (two) times daily for 4 days.   Atrovent HFA 17 MCG/ACT inhaler Generic drug:  ipratropium Inhale into the lungs.   cetirizine 10 MG tablet Commonly known as:  ZYRTEC Take 10 mg by mouth daily.   ipratropium-albuterol 0.5-2.5 (3) MG/3ML Soln Commonly known as:  DUONEB Take 3 mLs by nebulization every 6 (six) hours as needed. What changed:    how much to take  how to take this  when to take this  reasons to take this   lisinopril 40 MG tablet Commonly known as:  ZESTRIL Take 40 mg by mouth daily.   metoprolol succinate 50 MG 24 hr tablet Commonly known as:  TOPROL-XL Take 50 mg by mouth daily.   predniSONE 10 MG tablet Commonly known as:  DELTASONE Take 1 tablet (10 mg total) by mouth daily with breakfast. 60 mg PO (ORAL) x 2 days 50 mg PO (ORAL)  x 2 days 40 mg PO (ORAL)  x 2 days 30 mg PO  (ORAL)  x 2 days 20 mg PO  (ORAL) x 2 days 10 mg PO  (ORAL) x 2 days then stop   Proventil HFA 108 (90 Base) MCG/ACT inhaler Generic drug:  albuterol Inhale into the lungs.  Symbicort 160-4.5 MCG/ACT inhaler Generic drug:  budesonide-formoterol Inhale into the lungs.            Durable Medical Equipment  (From admission, onward)         Start     Ordered   11/17/18 1035  For home use only DME [facility]re[facility]  Once    Question:  Patient needs a walker to treat with the following condition  Answer:  Weakness   11/17/18 1035   11/17/18 1035  DME Oxygen  Once    Question Answer Comment  Length of Need Lifetime   Mode or (Route) Nasal cannula   Liters per Minute 2   Frequency Continuous (stationary and portable oxygen unit needed)   Oxygen  conserving device Yes   Oxygen delivery system Gas      11/17/18 1035   11/17/18 1035  For home use only DME Bedside commode  Once    Question:  Patient needs a bedside commode to treat with the following condition  Answer:  Weakness   11/17/18 1035            Today   CHIEF COMPLAINT:  Patient does not want to go to rehab.  She prefers to go home with home health.  Shortness of breath is improved.  Denies fever, cough, chills, wheezing.   VITAL SIGNS:  Blood pressure (!) 149/79, pulse 75, temperature (!) 97.4 F (36.3 C), temperature source Oral, resp. rate 20, height 5' 2.52" (1.588 m), weight 81 kg, SpO2 95 %.   REVIEW OF SYSTEMS:  Review of Systems  Constitutional: Negative.  Negative for chills, fever and malaise/fatigue.  HENT: Negative.  Negative for ear discharge, ear pain, hearing loss, nosebleeds and sore throat.   Eyes: Negative.  Negative for blurred vision and pain.  Respiratory: Negative.  Negative for cough, hemoptysis, shortness of breath and wheezing.   Cardiovascular: Negative.  Negative for chest pain, palpitations and leg swelling.  Gastrointestinal: Negative.  Negative for abdominal pain, blood in stool, diarrhea, nausea and vomiting.  Genitourinary: Negative.  Negative for dysuria.  Musculoskeletal: Negative.  Negative for back pain.  Skin: Negative.   Neurological: Negative for dizziness, tremors, speech change, focal weakness, seizures and headaches.  Endo/Heme/Allergies: Negative.  Does not bruise/bleed easily.  Psychiatric/Behavioral: Negative.  Negative for depression, hallucinations and suicidal ideas.     PHYSICAL EXAMINATION:  GENERAL:  68 y.o.-year-old patient lying in the bed with no acute distress.  NECK:  Supple, no jugular venous distention. No thyroid enlargement, no tenderness.  LUNGS: Normal breath sounds bilaterally, no wheezing, rales,rhonchi  No use of accessory muscles of respiration.  CARDIOVASCULAR: S1, S2 normal. No murmurs,  rubs, or gallops.  ABDOMEN: Soft, non-tender, non-distended. Bowel sounds present. No organomegaly or mass.  EXTREMITIES: No pedal edema, cyanosis, or clubbing.  PSYCHIATRIC: The patient is alert and oriented x 3.  SKIN: No obvious rash, lesion, or ulcer.   DATA REVIEW:   CBC Recent Labs  Lab 11/14/18 0232  WBC 7.9  HGB 13.9  HCT 45.6  PLT 201    Chemistries  Recent Labs  Lab 11/14/18 0232  11/17/18 0720  NA 144   < > 137  K 4.6   < > 4.3  CL 95*   < > 95*  CO2 36*   < > 34*  GLUCOSE 123*   < > 106*  BUN 28*   < > 24*  CREATININE 0.67   < > 0.69  CALCIUM 9.1   < >  9.1  MG 2.4   < > 2.1  AST 49*  --   --   ALT 302*  --   --   ALKPHOS 53  --   --   BILITOT 1.6*  --   --    < > = values in this interval not displayed.    Cardiac Enzymes Recent Labs  Lab 11/14/18 0736 11/14/18 1319 11/15/18 0324  TROPONINI <0.03 <0.03 <0.03    Microbiology Results  @MICRORSLT48 @  RADIOLOGY:  No results found.    Allergies as of 11/17/2018   No Known Allergies     Medication List    TAKE these medications   amLODipine 5 MG tablet Commonly known as:  NORVASC Take 5 mg by mouth daily.   amoxicillin-clavulanate 875-125 MG tablet Commonly known as:  Augmentin Take 1 tablet by mouth 2 (two) times daily for 4 days.   Atrovent HFA 17 MCG/ACT inhaler Generic drug:  ipratropium Inhale into the lungs.   cetirizine 10 MG tablet Commonly known as:  ZYRTEC Take 10 mg by mouth daily.   ipratropium-albuterol 0.5-2.5 (3) MG/3ML Soln Commonly known as:  DUONEB Take 3 mLs by nebulization every 6 (six) hours as needed. What changed:    how much to take  how to take this  when to take this  reasons to take this   lisinopril 40 MG tablet Commonly known as:  ZESTRIL Take 40 mg by mouth daily.   metoprolol succinate 50 MG 24 hr tablet Commonly known as:  TOPROL-XL Take 50 mg by mouth daily.   predniSONE 10 MG tablet Commonly known as:  DELTASONE Take 1 tablet  (10 mg total) by mouth daily with breakfast. 60 mg PO (ORAL) x 2 days 50 mg PO (ORAL)  x 2 days 40 mg PO (ORAL)  x 2 days 30 mg PO  (ORAL)  x 2 days 20 mg PO  (ORAL) x 2 days 10 mg PO  (ORAL) x 2 days then stop   Proventil HFA 108 (90 Base) MCG/ACT inhaler Generic drug:  albuterol Inhale into the lungs.   Symbicort 160-4.5 MCG/ACT inhaler Generic drug:  budesonide-formoterol Inhale into the lungs.            Durable Medical Equipment  (From admission, onward)         Start     Ordered   11/17/18 1035  For home use only DME Gilford Rile  [facility])  Once    Question:  Patient needs a walker to treat with the following condition  Answer:  Weakness   11/17/18 1035   11/17/18 1035  DME Oxygen  Once    Question Answer Comment  Length of Need Lifetime   Mode or (Route) Nasal cannula   Liters per Minute 2   Frequency Continuous (stationary and portable oxygen unit needed)   Oxygen conserving device Yes   Oxygen delivery system Gas      11/17/18 1035   11/17/18 1035  For home use only DME Bedside commode  Once    Question:  Patient needs a bedside commode to treat with the following condition  Answer:  Weakness   11/17/18 1035             Management plans discussed with the patient and she is in agreement. Stable for discharge   Patient should follow up with pcp  CODE STATUS:     Code Status Orders  (From admission, onward)         Start  Ordered   11/08/18 1809  Full code  Continuous     11/08/18 1809        Code Status History    This patient has a current code status but no historical code status.      TOTAL TIME TAKING CARE OF THIS PATIENT: 38 minutes.    Note: This dictation was prepared with Dragon dictation along with smaller phrase technology. Any transcriptional errors that result from this process are unintentional.  Adrian SaranSital Rotunda Worden M.D on 11/17/2018 at 11:50 AM  Between 7am to 6pm - Pager - [phone] After 6pm go to [email or web address] - [email or web address]  [facility]  [facility] [place] [facility]  Office  [phone]  CC: Primary care physician; Martie RoundSpencer, Nicole, NP

## 2018-11-17 NOTE — Progress Notes (Signed)
IVs and tele removed from patient. Discharge instructions given to patient along with hard copy prescriptions. Verbalized understanding. O2 and walker delivered and at bedside. No acute distress at this time. Patient to call family to transport home.

## 2018-11-17 NOTE — TOC Transition Note (Signed)
Transition of Care [facility]) - CM/SW Discharge Note   Patient Details  Name: Angela Berry MRN: [id number] Date of Birth: 18-Nov-1950  [facility] CM/SW Contact:  Elza Rafter, RN Phone Number: 11/17/2018, 10:32 AM   Clinical Narrative:   Patient is discharging to home with home health services.  Patient does not have a preference of agencies.  Made referral to [facility] for RN, PT, aide and SW.  Patient states she can get a ride home today.  Patient is requiring oxygen at discharge.  Referral to [facility] with [facility].  Leroy Sea is also working on and NIV for home.  No further needs identified at this time.      Final next level of care: [place] Barriers to Discharge: No Barriers Identified   Patient Goals and CMS Choice Patient states their goals for this hospitalization and ongoing recovery are:: Wants to go home with home health CMS Medicare.gov Compare Post Acute Care list provided to:: Patient Choice offered to / list presented to : Patient  Discharge Placement                       Discharge Plan and Services   Discharge Planning Services: CM Consult Post Acute Care Choice: [place]          DME Arranged: NIV, Oxygen DME Agency: [employer] Date DME Agency Contacted: 11/17/18 Time DME Agency Contacted: 16 Representative spoke with at DME Agency: [place]: RN, PT, OT, Nurse's Aide [place] Agency: [place] Date [place]: 11/17/18 Time [place]: 1032 Representative spoke with at [place] with [employer]  Social Determinants of Health ([place]) Interventions     Readmission Risk Interventions Readmission Risk Prevention Plan 11/16/2018  Transportation Screening Complete  PCP or Specialist Appt within 5-7 Days Complete  Home Care Screening Complete  Medication Review (RN CM) Complete  Some recent data might be hidden

## 2018-11-17 NOTE — Progress Notes (Signed)
SATURATION QUALIFICATIONS: (This note is used to comply with regulatory documentation for home oxygen)  Patient Saturations on Room Air at Rest = 86%  Patient Saturations on Room Air while Ambulating = n/a%  Patient Saturations on 2 Liters of oxygen while Ambulating = 93%  Please briefly explain why patient needs home oxygen: 

## 2019-04-09 ENCOUNTER — Other Ambulatory Visit: Payer: Self-pay | Admitting: Specialist

## 2019-04-09 DIAGNOSIS — Z9981 Dependence on supplemental oxygen: Secondary | ICD-10-CM

## 2019-04-09 DIAGNOSIS — J42 Unspecified chronic bronchitis: Secondary | ICD-10-CM

## 2019-04-09 DIAGNOSIS — R918 Other nonspecific abnormal finding of lung field: Secondary | ICD-10-CM

## 2019-05-11 ENCOUNTER — Ambulatory Visit: Payer: Medicare HMO

## 2019-06-15 DIAGNOSIS — I10 Essential (primary) hypertension: Secondary | ICD-10-CM | POA: Diagnosis not present

## 2019-06-15 DIAGNOSIS — F329 Major depressive disorder, single episode, unspecified: Secondary | ICD-10-CM | POA: Diagnosis not present

## 2019-06-15 DIAGNOSIS — R600 Localized edema: Secondary | ICD-10-CM | POA: Diagnosis not present

## 2019-06-15 DIAGNOSIS — Z1389 Encounter for screening for other disorder: Secondary | ICD-10-CM | POA: Diagnosis not present

## 2019-06-15 DIAGNOSIS — J449 Chronic obstructive pulmonary disease, unspecified: Secondary | ICD-10-CM | POA: Diagnosis not present

## 2019-06-15 DIAGNOSIS — E875 Hyperkalemia: Secondary | ICD-10-CM | POA: Diagnosis not present

## 2019-06-15 DIAGNOSIS — Z720 Tobacco use: Secondary | ICD-10-CM | POA: Diagnosis not present

## 2019-06-15 DIAGNOSIS — E871 Hypo-osmolality and hyponatremia: Secondary | ICD-10-CM | POA: Diagnosis not present

## 2019-06-15 DIAGNOSIS — Z23 Encounter for immunization: Secondary | ICD-10-CM | POA: Diagnosis not present

## 2019-06-15 DIAGNOSIS — E7800 Pure hypercholesterolemia, unspecified: Secondary | ICD-10-CM | POA: Diagnosis not present

## 2019-06-16 ENCOUNTER — Other Ambulatory Visit: Payer: Self-pay | Admitting: Family Medicine

## 2019-06-16 DIAGNOSIS — J449 Chronic obstructive pulmonary disease, unspecified: Secondary | ICD-10-CM | POA: Diagnosis not present

## 2019-06-16 DIAGNOSIS — Z0100 Encounter for examination of eyes and vision without abnormal findings: Secondary | ICD-10-CM | POA: Diagnosis not present

## 2019-06-16 DIAGNOSIS — H25813 Combined forms of age-related cataract, bilateral: Secondary | ICD-10-CM | POA: Diagnosis not present

## 2019-06-16 DIAGNOSIS — Z1231 Encounter for screening mammogram for malignant neoplasm of breast: Secondary | ICD-10-CM

## 2019-06-16 DIAGNOSIS — H524 Presbyopia: Secondary | ICD-10-CM | POA: Diagnosis not present

## 2019-06-16 DIAGNOSIS — I10 Essential (primary) hypertension: Secondary | ICD-10-CM | POA: Diagnosis not present

## 2019-08-05 DIAGNOSIS — J441 Chronic obstructive pulmonary disease with (acute) exacerbation: Secondary | ICD-10-CM | POA: Diagnosis not present

## 2019-08-05 DIAGNOSIS — I10 Essential (primary) hypertension: Secondary | ICD-10-CM | POA: Diagnosis not present

## 2019-08-09 DIAGNOSIS — Z20822 Contact with and (suspected) exposure to covid-19: Secondary | ICD-10-CM | POA: Diagnosis not present

## 2019-08-31 DIAGNOSIS — J441 Chronic obstructive pulmonary disease with (acute) exacerbation: Secondary | ICD-10-CM | POA: Diagnosis not present

## 2019-08-31 DIAGNOSIS — F329 Major depressive disorder, single episode, unspecified: Secondary | ICD-10-CM | POA: Diagnosis not present

## 2019-08-31 DIAGNOSIS — I10 Essential (primary) hypertension: Secondary | ICD-10-CM | POA: Diagnosis not present

## 2019-09-03 DIAGNOSIS — Z800 Family history of malignant neoplasm of digestive organs: Secondary | ICD-10-CM | POA: Diagnosis not present

## 2019-09-03 DIAGNOSIS — Z8371 Family history of colonic polyps: Secondary | ICD-10-CM | POA: Diagnosis not present

## 2019-09-03 DIAGNOSIS — Z86010 Personal history of colonic polyps: Secondary | ICD-10-CM | POA: Diagnosis not present

## 2019-09-03 DIAGNOSIS — J449 Chronic obstructive pulmonary disease, unspecified: Secondary | ICD-10-CM | POA: Diagnosis not present

## 2019-09-08 DIAGNOSIS — Z20822 Contact with and (suspected) exposure to covid-19: Secondary | ICD-10-CM | POA: Diagnosis not present

## 2019-09-14 DIAGNOSIS — J441 Chronic obstructive pulmonary disease with (acute) exacerbation: Secondary | ICD-10-CM | POA: Diagnosis not present

## 2019-09-14 DIAGNOSIS — I10 Essential (primary) hypertension: Secondary | ICD-10-CM | POA: Diagnosis not present

## 2019-09-14 DIAGNOSIS — E7800 Pure hypercholesterolemia, unspecified: Secondary | ICD-10-CM | POA: Diagnosis not present

## 2019-09-30 DIAGNOSIS — Z86010 Personal history of colonic polyps: Secondary | ICD-10-CM | POA: Diagnosis not present

## 2019-10-01 DIAGNOSIS — Z20822 Contact with and (suspected) exposure to covid-19: Secondary | ICD-10-CM | POA: Diagnosis not present

## 2019-10-12 DIAGNOSIS — F329 Major depressive disorder, single episode, unspecified: Secondary | ICD-10-CM | POA: Diagnosis not present

## 2019-10-12 DIAGNOSIS — J449 Chronic obstructive pulmonary disease, unspecified: Secondary | ICD-10-CM | POA: Diagnosis not present

## 2019-10-12 DIAGNOSIS — I10 Essential (primary) hypertension: Secondary | ICD-10-CM | POA: Diagnosis not present

## 2019-10-19 DIAGNOSIS — E7800 Pure hypercholesterolemia, unspecified: Secondary | ICD-10-CM | POA: Diagnosis not present

## 2019-10-19 DIAGNOSIS — J449 Chronic obstructive pulmonary disease, unspecified: Secondary | ICD-10-CM | POA: Diagnosis not present

## 2019-10-19 DIAGNOSIS — R600 Localized edema: Secondary | ICD-10-CM | POA: Diagnosis not present

## 2019-10-19 DIAGNOSIS — E871 Hypo-osmolality and hyponatremia: Secondary | ICD-10-CM | POA: Diagnosis not present

## 2019-10-19 DIAGNOSIS — Z1389 Encounter for screening for other disorder: Secondary | ICD-10-CM | POA: Diagnosis not present

## 2019-10-19 DIAGNOSIS — I10 Essential (primary) hypertension: Secondary | ICD-10-CM | POA: Diagnosis not present

## 2019-11-12 ENCOUNTER — Other Ambulatory Visit: Payer: Self-pay | Admitting: Family Medicine

## 2019-11-12 DIAGNOSIS — N644 Mastodynia: Secondary | ICD-10-CM

## 2020-01-09 DEATH — deceased

## 2020-04-01 IMAGING — DX PORTABLE CHEST - 1 VIEW
1 series · 1 of 1 positions shown · non-contrast
Comparison: 11/08/2018

CLINICAL DATA: Respiratory failure

EXAM:
PORTABLE CHEST 1 VIEW

[chest ap]
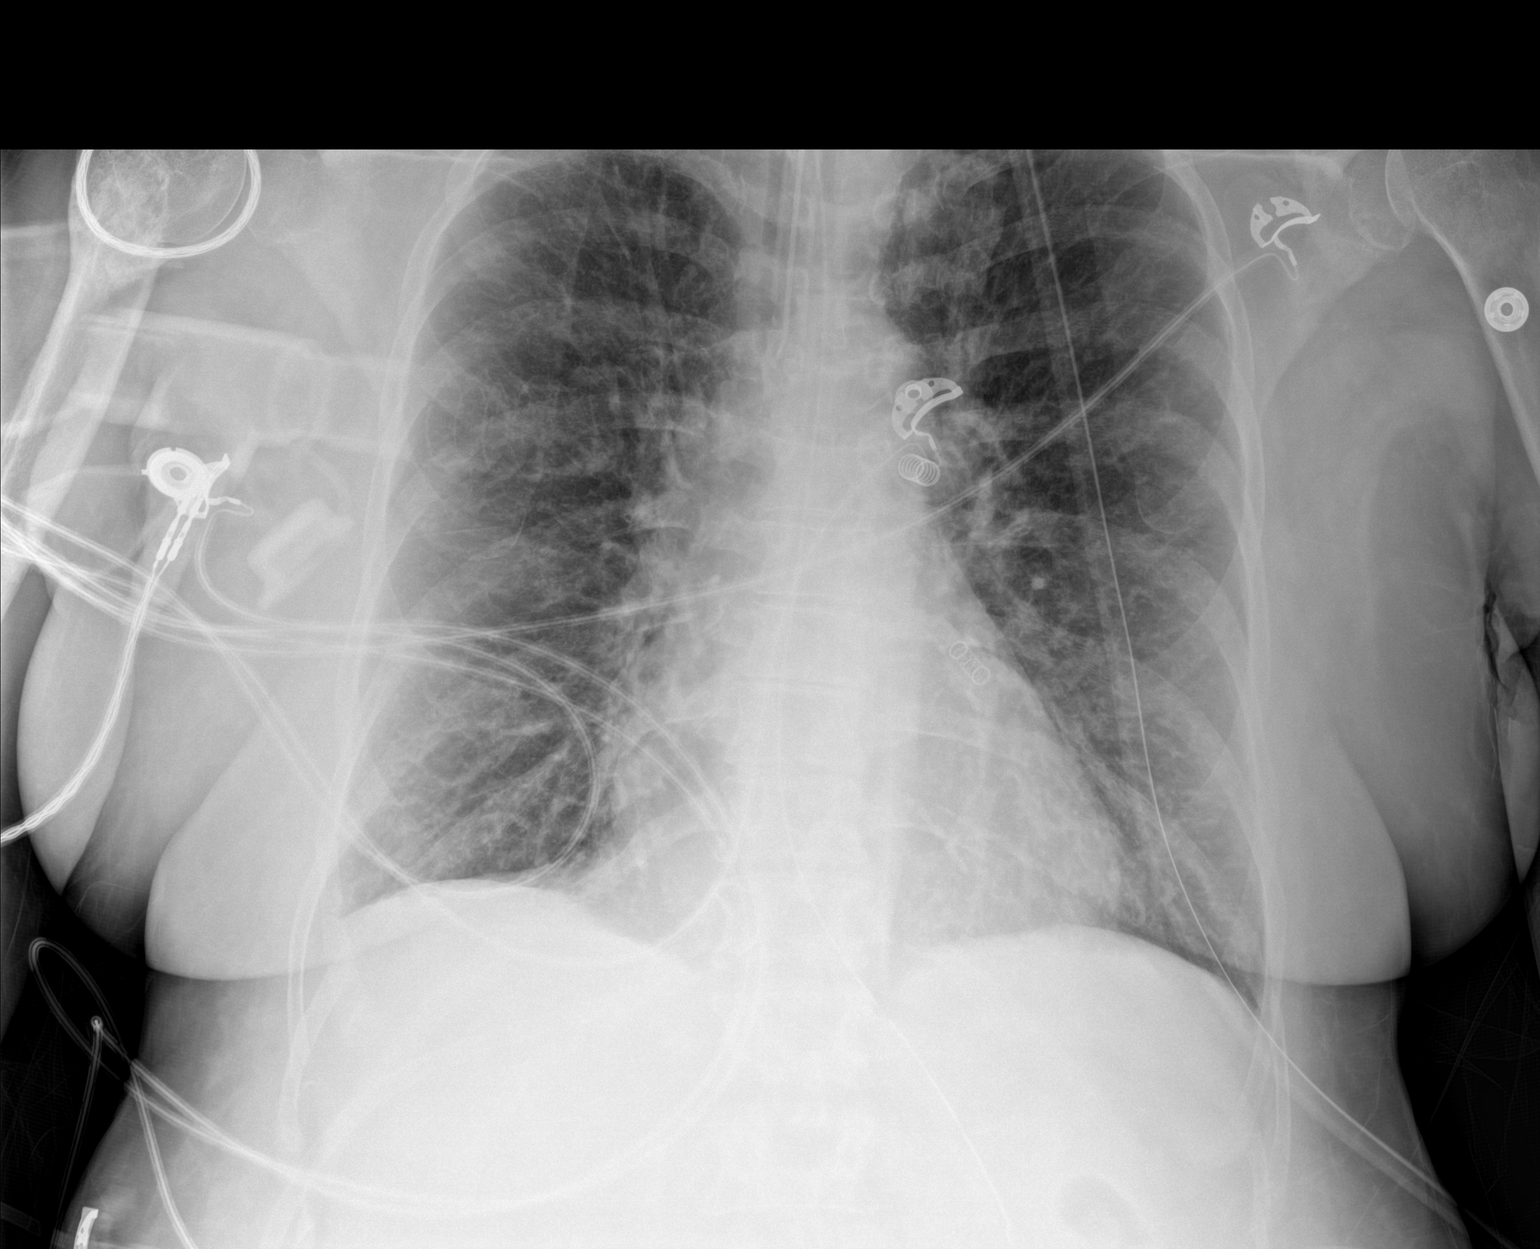

[1 of 1 positions shown; findings below may reference images not displayed]

FINDINGS: Cardiac shadow is stable. Endotracheal tube and gastric catheter are
again seen and stable. Lungs are well aerated bilaterally. Very mild
interstitial changes are seen which are improved when compared with
the prior exam. No new focal infiltrate or effusion is seen. No bony
abnormality is noted.
IMPRESSION: Slight improvement in the degree of interstitial edema.
